# Patient Record
Sex: Male | Born: 1940
Health system: Southern US, Community
[De-identification: ages and names within clinical notes are randomized; demographics above are authoritative.]

## PROBLEM LIST (undated history)

## (undated) DIAGNOSIS — M1990 Unspecified osteoarthritis, unspecified site: Secondary | ICD-10-CM

## (undated) DIAGNOSIS — R413 Other amnesia: Secondary | ICD-10-CM

## (undated) HISTORY — PX: TONSILLECTOMY: SUR1361

## (undated) HISTORY — PX: JOINT REPLACEMENT: SHX530

## (undated) HISTORY — PX: COLONOSCOPY: SHX174

---

## 2006-03-03 ENCOUNTER — Ambulatory Visit: Payer: Self-pay | Admitting: Unknown Physician Specialty

## 2007-08-13 ENCOUNTER — Ambulatory Visit: Payer: Self-pay | Admitting: Internal Medicine

## 2008-03-28 ENCOUNTER — Ambulatory Visit: Payer: Self-pay | Admitting: Cardiology

## 2008-03-28 ENCOUNTER — Ambulatory Visit: Payer: Self-pay | Admitting: Specialist

## 2008-04-06 ENCOUNTER — Inpatient Hospital Stay: Payer: Self-pay | Admitting: Specialist

## 2010-02-16 ENCOUNTER — Ambulatory Visit: Payer: Self-pay | Admitting: Specialist

## 2010-02-28 ENCOUNTER — Inpatient Hospital Stay: Payer: Self-pay | Admitting: Specialist

## 2015-02-24 DIAGNOSIS — Z23 Encounter for immunization: Secondary | ICD-10-CM | POA: Diagnosis not present

## 2015-04-05 DIAGNOSIS — R030 Elevated blood-pressure reading, without diagnosis of hypertension: Secondary | ICD-10-CM | POA: Diagnosis not present

## 2015-04-05 DIAGNOSIS — M1991 Primary osteoarthritis, unspecified site: Secondary | ICD-10-CM | POA: Diagnosis not present

## 2015-04-05 DIAGNOSIS — Z0000 Encounter for general adult medical examination without abnormal findings: Secondary | ICD-10-CM | POA: Diagnosis not present

## 2015-04-05 DIAGNOSIS — Z6823 Body mass index (BMI) 23.0-23.9, adult: Secondary | ICD-10-CM | POA: Diagnosis not present

## 2015-05-25 DIAGNOSIS — Z13220 Encounter for screening for lipoid disorders: Secondary | ICD-10-CM | POA: Diagnosis not present

## 2015-05-25 DIAGNOSIS — Z125 Encounter for screening for malignant neoplasm of prostate: Secondary | ICD-10-CM | POA: Diagnosis not present

## 2015-05-25 DIAGNOSIS — I10 Essential (primary) hypertension: Secondary | ICD-10-CM | POA: Diagnosis not present

## 2015-05-25 DIAGNOSIS — Z79899 Other long term (current) drug therapy: Secondary | ICD-10-CM | POA: Diagnosis not present

## 2015-05-25 DIAGNOSIS — E559 Vitamin D deficiency, unspecified: Secondary | ICD-10-CM | POA: Diagnosis not present

## 2015-06-28 DIAGNOSIS — D125 Benign neoplasm of sigmoid colon: Secondary | ICD-10-CM | POA: Diagnosis not present

## 2015-06-28 DIAGNOSIS — Z13220 Encounter for screening for lipoid disorders: Secondary | ICD-10-CM | POA: Diagnosis not present

## 2015-06-28 DIAGNOSIS — Z125 Encounter for screening for malignant neoplasm of prostate: Secondary | ICD-10-CM | POA: Diagnosis not present

## 2015-06-28 DIAGNOSIS — E559 Vitamin D deficiency, unspecified: Secondary | ICD-10-CM | POA: Diagnosis not present

## 2015-06-28 DIAGNOSIS — Z1211 Encounter for screening for malignant neoplasm of colon: Secondary | ICD-10-CM | POA: Diagnosis not present

## 2015-06-28 DIAGNOSIS — Z0100 Encounter for examination of eyes and vision without abnormal findings: Secondary | ICD-10-CM | POA: Diagnosis not present

## 2015-06-28 DIAGNOSIS — Z0000 Encounter for general adult medical examination without abnormal findings: Secondary | ICD-10-CM | POA: Diagnosis not present

## 2015-06-28 DIAGNOSIS — Z79899 Other long term (current) drug therapy: Secondary | ICD-10-CM | POA: Diagnosis not present

## 2015-06-28 DIAGNOSIS — L720 Epidermal cyst: Secondary | ICD-10-CM | POA: Diagnosis not present

## 2015-07-03 DIAGNOSIS — Z1211 Encounter for screening for malignant neoplasm of colon: Secondary | ICD-10-CM | POA: Diagnosis not present

## 2015-08-22 DIAGNOSIS — H2513 Age-related nuclear cataract, bilateral: Secondary | ICD-10-CM | POA: Diagnosis not present

## 2015-10-11 DIAGNOSIS — L728 Other follicular cysts of the skin and subcutaneous tissue: Secondary | ICD-10-CM | POA: Diagnosis not present

## 2016-02-09 DIAGNOSIS — Z23 Encounter for immunization: Secondary | ICD-10-CM | POA: Diagnosis not present

## 2016-06-21 DIAGNOSIS — Z13220 Encounter for screening for lipoid disorders: Secondary | ICD-10-CM | POA: Diagnosis not present

## 2016-06-21 DIAGNOSIS — E559 Vitamin D deficiency, unspecified: Secondary | ICD-10-CM | POA: Diagnosis not present

## 2016-06-21 DIAGNOSIS — Z79899 Other long term (current) drug therapy: Secondary | ICD-10-CM | POA: Diagnosis not present

## 2016-06-21 DIAGNOSIS — Z125 Encounter for screening for malignant neoplasm of prostate: Secondary | ICD-10-CM | POA: Diagnosis not present

## 2016-06-28 DIAGNOSIS — E559 Vitamin D deficiency, unspecified: Secondary | ICD-10-CM | POA: Diagnosis not present

## 2016-06-28 DIAGNOSIS — M25511 Pain in right shoulder: Secondary | ICD-10-CM | POA: Diagnosis not present

## 2016-06-28 DIAGNOSIS — R918 Other nonspecific abnormal finding of lung field: Secondary | ICD-10-CM | POA: Diagnosis not present

## 2016-06-28 DIAGNOSIS — Z0000 Encounter for general adult medical examination without abnormal findings: Secondary | ICD-10-CM | POA: Diagnosis not present

## 2016-06-28 DIAGNOSIS — M19011 Primary osteoarthritis, right shoulder: Secondary | ICD-10-CM | POA: Diagnosis not present

## 2016-06-28 DIAGNOSIS — D649 Anemia, unspecified: Secondary | ICD-10-CM | POA: Diagnosis not present

## 2016-06-28 DIAGNOSIS — M1990 Unspecified osteoarthritis, unspecified site: Secondary | ICD-10-CM | POA: Diagnosis not present

## 2016-07-08 DIAGNOSIS — D649 Anemia, unspecified: Secondary | ICD-10-CM | POA: Diagnosis not present

## 2017-01-08 DIAGNOSIS — M79671 Pain in right foot: Secondary | ICD-10-CM | POA: Diagnosis not present

## 2017-01-08 DIAGNOSIS — M2031 Hallux varus (acquired), right foot: Secondary | ICD-10-CM | POA: Diagnosis not present

## 2017-01-08 DIAGNOSIS — M2021 Hallux rigidus, right foot: Secondary | ICD-10-CM | POA: Diagnosis not present

## 2017-07-03 DIAGNOSIS — R634 Abnormal weight loss: Secondary | ICD-10-CM | POA: Diagnosis not present

## 2017-07-03 DIAGNOSIS — R413 Other amnesia: Secondary | ICD-10-CM | POA: Diagnosis not present

## 2017-07-03 DIAGNOSIS — Z79899 Other long term (current) drug therapy: Secondary | ICD-10-CM | POA: Diagnosis not present

## 2017-07-03 DIAGNOSIS — E559 Vitamin D deficiency, unspecified: Secondary | ICD-10-CM | POA: Diagnosis not present

## 2017-07-03 DIAGNOSIS — Z1211 Encounter for screening for malignant neoplasm of colon: Secondary | ICD-10-CM | POA: Diagnosis not present

## 2017-07-03 DIAGNOSIS — R7309 Other abnormal glucose: Secondary | ICD-10-CM | POA: Diagnosis not present

## 2017-07-03 DIAGNOSIS — J439 Emphysema, unspecified: Secondary | ICD-10-CM | POA: Diagnosis not present

## 2017-07-03 DIAGNOSIS — Z125 Encounter for screening for malignant neoplasm of prostate: Secondary | ICD-10-CM | POA: Diagnosis not present

## 2017-07-11 DIAGNOSIS — Z1211 Encounter for screening for malignant neoplasm of colon: Secondary | ICD-10-CM | POA: Diagnosis not present

## 2017-10-07 DIAGNOSIS — E559 Vitamin D deficiency, unspecified: Secondary | ICD-10-CM | POA: Diagnosis not present

## 2017-10-07 DIAGNOSIS — Z13220 Encounter for screening for lipoid disorders: Secondary | ICD-10-CM | POA: Diagnosis not present

## 2017-10-07 DIAGNOSIS — M1990 Unspecified osteoarthritis, unspecified site: Secondary | ICD-10-CM | POA: Diagnosis not present

## 2017-10-07 DIAGNOSIS — Z0000 Encounter for general adult medical examination without abnormal findings: Secondary | ICD-10-CM | POA: Diagnosis not present

## 2017-10-07 DIAGNOSIS — Z79899 Other long term (current) drug therapy: Secondary | ICD-10-CM | POA: Diagnosis not present

## 2017-10-07 DIAGNOSIS — R7309 Other abnormal glucose: Secondary | ICD-10-CM | POA: Diagnosis not present

## 2018-01-07 DIAGNOSIS — Z13220 Encounter for screening for lipoid disorders: Secondary | ICD-10-CM | POA: Diagnosis not present

## 2018-01-07 DIAGNOSIS — R7309 Other abnormal glucose: Secondary | ICD-10-CM | POA: Diagnosis not present

## 2018-01-07 DIAGNOSIS — Z79899 Other long term (current) drug therapy: Secondary | ICD-10-CM | POA: Diagnosis not present

## 2018-01-14 DIAGNOSIS — E559 Vitamin D deficiency, unspecified: Secondary | ICD-10-CM | POA: Diagnosis not present

## 2018-01-14 DIAGNOSIS — R634 Abnormal weight loss: Secondary | ICD-10-CM | POA: Diagnosis not present

## 2018-02-25 DIAGNOSIS — Z23 Encounter for immunization: Secondary | ICD-10-CM | POA: Diagnosis not present

## 2018-04-08 DIAGNOSIS — K4090 Unilateral inguinal hernia, without obstruction or gangrene, not specified as recurrent: Secondary | ICD-10-CM | POA: Diagnosis not present

## 2018-06-16 ENCOUNTER — Ambulatory Visit: Payer: Self-pay | Admitting: General Surgery

## 2018-06-16 ENCOUNTER — Other Ambulatory Visit: Payer: Self-pay

## 2018-06-16 ENCOUNTER — Encounter
Admission: RE | Admit: 2018-06-16 | Discharge: 2018-06-16 | Disposition: A | Discharge status: Home / Self Care | Payer: Medicare HMO | Source: Ambulatory Visit | Attending: General Surgery | Admitting: General Surgery

## 2018-06-16 HISTORY — DX: Unspecified osteoarthritis, unspecified site: M19.90

## 2018-06-16 MED ORDER — CEFAZOLIN SODIUM-DEXTROSE 2-4 GM/100ML-% IV SOLN
2.0000 g | INTRAVENOUS | Status: AC
  Administered 2018-06-17: 2 g via INTRAVENOUS

## 2018-06-16 NOTE — Patient Instructions (Signed)
Your procedure is scheduled on: 06-17-18  Report to Same Day Surgery 2nd floor medical mall [facility] Entrance-take elevator on left to 2nd floor.  Check in with surgery information desk.) @ 6 AM   Remember: Instructions that are not followed completely may result in serious medical risk, up to and including death, or upon the discretion of your surgeon and anesthesiologist your surgery may need to be rescheduled.    _x___ 1. Do not eat food after midnight the night before your procedure. You may drink clear liquids up to 2 hours before you are scheduled to arrive at the hospital for your procedure.  Do not drink clear liquids within 2 hours of your scheduled arrival to the hospital.  Clear liquids include  --Water or Apple juice without pulp  --Clear carbohydrate beverage such as ClearFast or Gatorade  --Black Coffee or Clear Tea (No milk, no creamers, do not add anything to  the coffee or Tea   ____Ensure clear carbohydrate drink on the way to the hospital for bariatric patients  ____Ensure clear carbohydrate drink 3 hours before surgery for Dr Dwyane Luo patients if physician instructed.   No gum chewing or hard candies.     __x__ 2. No Alcohol for 24 hours before or after surgery.   __x__3. No Smoking or e-cigarettes for 24 prior to surgery.  Do not use any chewable tobacco products for at least 6 hour prior to surgery   ____  4. Bring all medications with you on the day of surgery if instructed.    __x__ 5. Notify your doctor if there is any change in your medical condition     (cold, fever, infections).    x___6. On the morning of surgery brush your teeth with toothpaste and water.  You may rinse your mouth with mouth wash if you wish.  Do not swallow any toothpaste or mouthwash.   Do not wear jewelry, make-up, hairpins, clips or nail polish.  Do not wear lotions, powders, or perfumes. You may wear deodorant.  Do not shave 48 hours prior to surgery. Men may shave face and  neck.  Do not bring valuables to the hospital.    [facility] is not responsible for any belongings or valuables.               Contacts, dentures or bridgework may not be worn into surgery.  Leave your suitcase in the car. After surgery it may be brought to your room.  For patients admitted to the hospital, discharge time is determined by your                       treatment team.  _  Patients discharged the day of surgery will not be allowed to drive home.  You will need someone to drive you home and stay with you the night of your procedure.    Please read over the following fact sheets that you were given:   [facility] Preparing for Surgery and or MRSA Information   ____ Take anti-hypertensive listed below, cardiac, seizure, asthma,     anti-reflux and psychiatric medicines. These include:  1. NONE  2.  3.  4.  5.  6.  ____Fleets enema or Magnesium Citrate as directed.   ____ Use CHG Soap or sage wipes as directed on instruction sheet   ____ Use inhalers on the day of surgery and bring to hospital day of surgery  ____ Stop Metformin and Janumet 2 days  prior to surgery.    ____ Take 1/2 of usual insulin dose the night before surgery and none on the morning surgery.   ____ Follow recommendations from Cardiologist, Pulmonologist or PCP regarding stopping Aspirin, Coumadin, Plavix ,Eliquis, Effient, or Pradaxa, and Pletal.  X____Stop Anti-inflammatories such as Advil, Aleve, Ibuprofen, Motrin, Naproxen, Naprosyn, Goodies powders or aspirin products NOW-OK to take Tylenol    ____ Stop supplements until after surgery.     ____ Bring C-Pap to the hospital.

## 2018-06-16 NOTE — H&P (Signed)
PATIENT PROFILE: Jesus Scott is a 78 y.o. male who presents to the Clinic for consultation at the request of Dr. Doy Hutching for evaluation of left inguinal hernia.  PCP:  Idelle Crouch, MD  HISTORY OF PRESENT ILLNESS: Jesus Scott reports having a left inguinal hernia since a year and a half ago. He refers that he is feeling more pain and discomfort on the left inguinal area. Pain does not radiates. Pain is aggravated with heavy lifting. Pain improved with sitting down and resting. He feels the bulge that is able to reduce lying down. Denies abdominal pain, abdominal distention, nausea or vomiting.    PROBLEM LIST:         Problem List  Date Reviewed: 05/27/2018         Noted   Memory disturbance 05/27/2018   Adenomatous polyp of sigmoid colon 06/28/2015   Vitamin D deficiency, unspecified 06/28/2015   Osteoarthritis Unknown   Overview    with bilateral knee pain      Internal hemorrhoids Unknown      GENERAL REVIEW OF SYSTEMS:   General ROS: negative for - chills, fatigue, fever, weight gain or weight loss Allergy and Immunology ROS: negative for - hives  Hematological and Lymphatic ROS: negative for - bleeding problems or bruising, negative for palpable nodes Endocrine ROS: negative for - heat or cold intolerance, hair changes Respiratory ROS: negative for - cough, shortness of breath or wheezing Cardiovascular ROS: no chest pain or palpitations GI ROS: negative for nausea, vomiting, abdominal pain, diarrhea, constipation Musculoskeletal ROS: negative for - joint swelling or muscle pain Neurological ROS: negative for - confusion, syncope Dermatological ROS: negative for pruritus and rash Psychiatric: negative for anxiety, depression, difficulty sleeping. Positive for memory loss  MEDICATIONS: CurrentMedications        Current Outpatient Medications  Medication Sig Dispense Refill  . amitriptyline (ELAVIL) 25 MG tablet Take 1 tablet (25 mg total) by  mouth nightly 90 tablet 3  . ascorbic acid, vitamin C, (VITAMIN C) 500 MG tablet Take 500 mg by mouth once daily.    . cholecalciferol (CHOLECALCIFEROL) 1,000 unit tablet Take 5,000 Units by mouth.      . NON FORMULARY Prevagen  -- 1 tablet every day    . VITAMIN B COMPLEX (B COMPLEX 1 ORAL) Take by mouth.     No current facility-administered medications for this visit.       ALLERGIES: Patient has no known allergies.  PAST MEDICAL HISTORY:     Past Medical History:  Diagnosis Date  . Internal hemorrhoids   . Osteoarthritis    with bilateral knee pain    PAST SURGICAL HISTORY:      Past Surgical History:  Procedure Laterality Date  . BILATERAL KNEE SURGERIES    . COLONOSCOPY  03/03/2006   Hyperplastic Polyps: CBF 02/2016; Recall Ltr mailed 01/01/2016 (dw)  . TONSILLECTOMY    . VASECTOMY       FAMILY HISTORY:      Family History  Problem Relation Age of Onset  . Stroke Mother 55     SOCIAL HISTORY: Social History          Socioeconomic History  . Marital status: Married    Spouse name: Not on file  . Number of children: Not on file  . Years of education: Not on file  . Highest education level: Not on file  Occupational History  . Not on file  Social Needs  . Financial resource strain: Not on file  .  Food insecurity:    Worry: Not on file    Inability: Not on file  . Transportation needs:    Medical: Not on file    Non-medical: Not on file  Tobacco Use  . Smoking status: Light Tobacco Smoker    Types: Cigars  . Smokeless tobacco: Never Used  . Tobacco comment: occasionally  Substance and Sexual Activity  . Alcohol use: Yes    Comment: ONLY DRINKS OCCASIONAL ALCOHOL  . Drug use: Not on file  . Sexual activity: Not on file  Other Topics Concern  . Not on file  Social History Narrative  . Not on file      PHYSICAL EXAM:    Vitals:   06/09/18 0950  BP: 127/71  Pulse: 77   Body mass index is  22.84 kg/m. Weight: 80.7 kg (177 lb 14.6 oz)   GENERAL: Alert, active, oriented x3  HEENT: Pupils equal reactive to light. Extraocular movements are intact. Sclera clear. Palpebral conjunctiva normal red color.Pharynx clear.  NECK: Supple with no palpable mass and no adenopathy.  LUNGS: Sound clear with no rales rhonchi or wheezes.  HEART: Regular rhythm S1 and S2 without murmur.  ABDOMEN: Soft and depressible, nontender with no palpable mass, no hepatomegaly. Left inguinal hernia, reducible, soft, mild discomfort.   EXTREMITIES: Well-developed well-nourished symmetrical with no dependent edema.  NEUROLOGICAL: Awake alert oriented, facial expression symmetrical, moving all extremities.  REVIEW OF DATA: I have reviewed the following data today:      No visits with results within 3 Month(s) from this visit.  Latest known visit with results is:  Appointment on 01/07/2018  Component Date Value  . Glucose 01/07/2018 90   . Sodium 01/07/2018 139   . Potassium 01/07/2018 4.1   . Chloride 01/07/2018 105   . Carbon Dioxide (CO2) 01/07/2018 28.6   . Urea Nitrogen (BUN) 01/07/2018 12   . Creatinine 01/07/2018 0.9   . Glomerular Filtration Ra* 01/07/2018 82   . Calcium 01/07/2018 8.5*  . AST  01/07/2018 16   . ALT  01/07/2018 10   . Alk Phos (alkaline Phosp* 01/07/2018 51   . Albumin 01/07/2018 3.8   . Bilirubin, Total 01/07/2018 0.7   . Protein, Total 01/07/2018 6.8   . A/G Ratio 01/07/2018 1.3   . WBC (White Blood Cell Co* 01/07/2018 4.2   . RBC (Red Blood Cell Coun* 01/07/2018 4.11*  . Hemoglobin 01/07/2018 13.1*  . Hematocrit 01/07/2018 38.5*  . MCV (Mean Corpuscular Vo* 01/07/2018 93.7   . MCH (Mean Corpuscular He* 01/07/2018 31.9*  . MCHC (Mean Corpuscular H* 01/07/2018 34.0   . Platelet Count 01/07/2018 146*  . RDW-CV (Red Cell Distrib* 01/07/2018 12.3   . MPV (Mean Platelet Volum* 01/07/2018 8.6*  . Neutrophils 01/07/2018 2.27   . Lymphocytes 01/07/2018 1.31    . Monocytes 01/07/2018 0.43   . Eosinophils 01/07/2018 0.11   . Basophils 01/07/2018 0.04   . Neutrophil % 01/07/2018 54.5   . Lymphocyte % 01/07/2018 31.4   . Monocyte % 01/07/2018 10.3   . Eosinophil % 01/07/2018 2.6   . Basophil% 01/07/2018 1.0   . Immature Granulocyte % 01/07/2018 0.2   . Immature Granulocyte Cou* 01/07/2018 0.01   . Cholesterol, Total 01/07/2018 141   . Hemoglobin A1C 01/07/2018 5.5   . Average Blood Glucose (C* 01/07/2018 111      ASSESSMENT: Jesus Scott is a 79 y.o. male presenting for consultation for left inguinal hernia.    The patient presents with  a symptomatic, reducible inguinal hernia. Patient was oriented about the diagnosis of inguinal hernia and its implication. The patient was oriented about the treatment alternatives (observation vs surgical repair). Due to patient symptoms, repair is recommended. Patient oriented about the surgical procedure, the use of mesh and its risk of complications such as: infection, bleeding, injury to vas deference, vasculature and testicle, injury to bowel or bladder, and chronic pain.  Non-recurrent unilateral inguinal hernia without obstruction or gangrene [K40.90]  PLAN: 1. Left inguinal hernia repair with mesh ([id number]) 2. CBC, CMP 3. Contact us if has any question or concer.   Patient and his wife verbalized understanding, all questions were answered, and were agreeable with the plan outlined above.    Herbert Pun, MD  Electronically signed by Herbert Pun, MD

## 2018-06-16 NOTE — H&P (View-Only) (Signed)
PATIENT PROFILE: Jesus Scott is a 78 y.o. male who presents to the Clinic for consultation at the request of Dr. Doy Hutching for evaluation of left inguinal hernia.  PCP:  Idelle Crouch, MD  HISTORY OF PRESENT ILLNESS: [name] reports having a left inguinal hernia since a year and a half ago. He refers that he is feeling more pain and discomfort on the left inguinal area. Pain does not radiates. Pain is aggravated with heavy lifting. Pain improved with sitting down and resting. He feels the bulge that is able to reduce lying down. Denies abdominal pain, abdominal distention, nausea or vomiting.    PROBLEM LIST:         Problem List  Date Reviewed: 05/27/2018         Noted   Memory disturbance 05/27/2018   Adenomatous polyp of sigmoid colon 06/28/2015   Vitamin D deficiency, unspecified 06/28/2015   Osteoarthritis Unknown   Overview    with bilateral knee pain      Internal hemorrhoids Unknown      GENERAL REVIEW OF SYSTEMS:   General ROS: negative for - chills, fatigue, fever, weight gain or weight loss Allergy and Immunology ROS: negative for - hives  Hematological and Lymphatic ROS: negative for - bleeding problems or bruising, negative for palpable nodes Endocrine ROS: negative for - heat or cold intolerance, hair changes Respiratory ROS: negative for - cough, shortness of breath or wheezing Cardiovascular ROS: no chest pain or palpitations GI ROS: negative for nausea, vomiting, abdominal pain, diarrhea, constipation Musculoskeletal ROS: negative for - joint swelling or muscle pain Neurological ROS: negative for - confusion, syncope Dermatological ROS: negative for pruritus and rash Psychiatric: negative for anxiety, depression, difficulty sleeping. Positive for memory loss  MEDICATIONS: CurrentMedications        Current Outpatient Medications  Medication Sig Dispense Refill  . amitriptyline (ELAVIL) 25 MG tablet Take 1 tablet (25 mg total) by  mouth nightly 90 tablet 3  . ascorbic acid, vitamin C, (VITAMIN C) 500 MG tablet Take 500 mg by mouth once daily.    . cholecalciferol (CHOLECALCIFEROL) 1,000 unit tablet Take 5,000 Units by mouth.      . NON FORMULARY Prevagen  -- 1 tablet every day    . VITAMIN B COMPLEX (B COMPLEX 1 ORAL) Take by mouth.     No current facility-administered medications for this visit.       ALLERGIES: Patient has no known allergies.  PAST MEDICAL HISTORY:     Past Medical History:  Diagnosis Date  . Internal hemorrhoids   . Osteoarthritis    with bilateral knee pain    PAST SURGICAL HISTORY:      Past Surgical History:  Procedure Laterality Date  . BILATERAL KNEE SURGERIES    . COLONOSCOPY  03/03/2006   Hyperplastic Polyps: CBF 02/2016; Recall Ltr mailed 01/01/2016 (dw)  . TONSILLECTOMY    . VASECTOMY       FAMILY HISTORY:      Family History  Problem Relation Age of Onset  . Stroke Mother 70     SOCIAL HISTORY: Social History          Socioeconomic History  . Marital status: Married    Spouse name: Not on file  . Number of children: Not on file  . Years of education: Not on file  . Highest education level: Not on file  Occupational History  . Not on file  Social Needs  . Financial resource strain: Not on file  .  Food insecurity:    Worry: Not on file    Inability: Not on file  . Transportation needs:    Medical: Not on file    Non-medical: Not on file  Tobacco Use  . Smoking status: Light Tobacco Smoker    Types: Cigars  . Smokeless tobacco: Never Used  . Tobacco comment: occasionally  Substance and Sexual Activity  . Alcohol use: Yes    Comment: ONLY DRINKS OCCASIONAL ALCOHOL  . Drug use: Not on file  . Sexual activity: Not on file  Other Topics Concern  . Not on file  Social History Narrative  . Not on file      PHYSICAL EXAM:    Vitals:   06/09/18 0950  BP: 127/71  Pulse: 77   Body mass index is  22.84 kg/m. Weight: 80.7 kg (177 lb 14.6 oz)   GENERAL: Alert, active, oriented x3  HEENT: Pupils equal reactive to light. Extraocular movements are intact. Sclera clear. Palpebral conjunctiva normal red color.Pharynx clear.  NECK: Supple with no palpable mass and no adenopathy.  LUNGS: Sound clear with no rales rhonchi or wheezes.  HEART: Regular rhythm S1 and S2 without murmur.  ABDOMEN: Soft and depressible, nontender with no palpable mass, no hepatomegaly. Left inguinal hernia, reducible, soft, mild discomfort.   EXTREMITIES: Well-developed well-nourished symmetrical with no dependent edema.  NEUROLOGICAL: Awake alert oriented, facial expression symmetrical, moving all extremities.  REVIEW OF DATA: I have reviewed the following data today:      No visits with results within 3 Month(s) from this visit.  Latest known visit with results is:  Appointment on 01/07/2018  Component Date Value  . Glucose 01/07/2018 90   . Sodium 01/07/2018 139   . Potassium 01/07/2018 4.1   . Chloride 01/07/2018 105   . Carbon Dioxide (CO2) 01/07/2018 28.6   . Urea Nitrogen (BUN) 01/07/2018 12   . Creatinine 01/07/2018 0.9   . Glomerular Filtration Ra* 01/07/2018 82   . Calcium 01/07/2018 8.5*  . AST  01/07/2018 16   . ALT  01/07/2018 10   . Alk Phos (alkaline Phosp* 01/07/2018 51   . Albumin 01/07/2018 3.8   . Bilirubin, Total 01/07/2018 0.7   . Protein, Total 01/07/2018 6.8   . A/G Ratio 01/07/2018 1.3   . WBC (White Blood Cell Co* 01/07/2018 4.2   . RBC (Red Blood Cell Coun* 01/07/2018 4.11*  . Hemoglobin 01/07/2018 13.1*  . Hematocrit 01/07/2018 38.5*  . MCV (Mean Corpuscular Vo* 01/07/2018 93.7   . MCH (Mean Corpuscular He* 01/07/2018 31.9*  . MCHC (Mean Corpuscular H* 01/07/2018 34.0   . Platelet Count 01/07/2018 146*  . RDW-CV (Red Cell Distrib* 01/07/2018 12.3   . MPV (Mean Platelet Volum* 01/07/2018 8.6*  . Neutrophils 01/07/2018 2.27   . Lymphocytes 01/07/2018 1.31    . Monocytes 01/07/2018 0.43   . Eosinophils 01/07/2018 0.11   . Basophils 01/07/2018 0.04   . Neutrophil % 01/07/2018 54.5   . Lymphocyte % 01/07/2018 31.4   . Monocyte % 01/07/2018 10.3   . Eosinophil % 01/07/2018 2.6   . Basophil% 01/07/2018 1.0   . Immature Granulocyte % 01/07/2018 0.2   . Immature Granulocyte Cou* 01/07/2018 0.01   . Cholesterol, Total 01/07/2018 141   . Hemoglobin A1C 01/07/2018 5.5   . Average Blood Glucose (C* 01/07/2018 111      ASSESSMENT: Mr. Mendiola is a 78 y.o. male presenting for consultation for left inguinal hernia.    The patient presents with  a symptomatic, reducible inguinal hernia. Patient was oriented about the diagnosis of inguinal hernia and its implication. The patient was oriented about the treatment alternatives (observation vs surgical repair). Due to patient symptoms, repair is recommended. Patient oriented about the surgical procedure, the use of mesh and its risk of complications such as: infection, bleeding, injury to vas deference, vasculature and testicle, injury to bowel or bladder, and chronic pain.  Non-recurrent unilateral inguinal hernia without obstruction or gangrene [K40.90]  PLAN: 1. Left inguinal hernia repair with mesh (61518) 2. CBC, CMP 3. Contact us if has any question or concer.   Patient and his wife verbalized understanding, all questions were answered, and were agreeable with the plan outlined above.    Herbert Pun, MD  Electronically signed by Herbert Pun, MD

## 2018-06-17 ENCOUNTER — Ambulatory Visit
Admission: RE | Admit: 2018-06-17 | Discharge: 2018-06-17 | Disposition: A | Discharge status: Home / Self Care | Payer: Medicare HMO | Attending: General Surgery | Admitting: General Surgery

## 2018-06-17 ENCOUNTER — Ambulatory Visit: Payer: Medicare HMO | Admitting: Anesthesiology

## 2018-06-17 ENCOUNTER — Encounter: Payer: Self-pay | Admitting: *Deleted

## 2018-06-17 ENCOUNTER — Other Ambulatory Visit: Payer: Self-pay

## 2018-06-17 ENCOUNTER — Encounter
Admission: RE | Disposition: A | Discharge status: Home / Self Care | Payer: Self-pay | Source: Home / Self Care | Attending: General Surgery

## 2018-06-17 DIAGNOSIS — E559 Vitamin D deficiency, unspecified: Secondary | ICD-10-CM | POA: Insufficient documentation

## 2018-06-17 DIAGNOSIS — K4090 Unilateral inguinal hernia, without obstruction or gangrene, not specified as recurrent: Secondary | ICD-10-CM | POA: Insufficient documentation

## 2018-06-17 DIAGNOSIS — Z79899 Other long term (current) drug therapy: Secondary | ICD-10-CM | POA: Diagnosis not present

## 2018-06-17 DIAGNOSIS — F17290 Nicotine dependence, other tobacco product, uncomplicated: Secondary | ICD-10-CM | POA: Insufficient documentation

## 2018-06-17 HISTORY — PX: INGUINAL HERNIA REPAIR: SHX194

## 2018-06-17 SURGERY — REPAIR, HERNIA, INGUINAL, ADULT
Anesthesia: General | Site: Abdomen | Laterality: Left

## 2018-06-17 MED ORDER — LIDOCAINE HCL (CARDIAC) PF 100 MG/5ML IV SOSY
PREFILLED_SYRINGE | INTRAVENOUS | Status: DC | PRN
  Administered 2018-06-17: 100 mg via INTRAVENOUS

## 2018-06-17 MED ORDER — BUPIVACAINE-EPINEPHRINE (PF) 0.25% -1:200000 IJ SOLN
INTRAMUSCULAR | Status: AC
  Filled 2018-06-17: qty 30

## 2018-06-17 MED ORDER — MEPERIDINE HCL 50 MG/ML IJ SOLN: 6.2500 mg | INTRAMUSCULAR | Status: DC | PRN

## 2018-06-17 MED ORDER — ROCURONIUM BROMIDE 50 MG/5ML IV SOLN
INTRAVENOUS | Status: AC
  Filled 2018-06-17: qty 1

## 2018-06-17 MED ORDER — OXYCODONE HCL 5 MG PO TABS: 5.0000 mg | ORAL_TABLET | Freq: Once | ORAL | Status: DC | PRN

## 2018-06-17 MED ORDER — SUGAMMADEX SODIUM 200 MG/2ML IV SOLN
INTRAVENOUS | Status: DC | PRN
  Administered 2018-06-17: 200 mg via INTRAVENOUS

## 2018-06-17 MED ORDER — FAMOTIDINE 20 MG PO TABS: 20.0000 mg | ORAL_TABLET | Freq: Once | ORAL | Status: DC

## 2018-06-17 MED ORDER — OXYCODONE HCL 5 MG/5ML PO SOLN: 5.0000 mg | Freq: Once | ORAL | Status: DC | PRN

## 2018-06-17 MED ORDER — FENTANYL CITRATE (PF) 100 MCG/2ML IJ SOLN
INTRAMUSCULAR | Status: DC | PRN
  Administered 2018-06-17 (×2): 50 ug via INTRAVENOUS

## 2018-06-17 MED ORDER — ONDANSETRON HCL 4 MG/2ML IJ SOLN
INTRAMUSCULAR | Status: AC
  Filled 2018-06-17: qty 2

## 2018-06-17 MED ORDER — PHENYLEPHRINE HCL 10 MG/ML IJ SOLN
INTRAMUSCULAR | Status: DC | PRN
  Administered 2018-06-17 (×2): 100 ug via INTRAVENOUS

## 2018-06-17 MED ORDER — LIDOCAINE HCL (PF) 2 % IJ SOLN
INTRAMUSCULAR | Status: AC
  Filled 2018-06-17: qty 10

## 2018-06-17 MED ORDER — FENTANYL CITRATE (PF) 100 MCG/2ML IJ SOLN
25.0000 ug | INTRAMUSCULAR | Status: DC | PRN
  Administered 2018-06-17: 25 ug via INTRAVENOUS

## 2018-06-17 MED ORDER — DEXAMETHASONE SODIUM PHOSPHATE 10 MG/ML IJ SOLN
INTRAMUSCULAR | Status: AC
  Filled 2018-06-17: qty 1

## 2018-06-17 MED ORDER — PROPOFOL 10 MG/ML IV BOLUS
INTRAVENOUS | Status: AC
  Filled 2018-06-17: qty 20

## 2018-06-17 MED ORDER — FENTANYL CITRATE (PF) 100 MCG/2ML IJ SOLN
INTRAMUSCULAR | Status: AC
  Filled 2018-06-17: qty 2

## 2018-06-17 MED ORDER — ONDANSETRON HCL 4 MG/2ML IJ SOLN
INTRAMUSCULAR | Status: DC | PRN
  Administered 2018-06-17: 4 mg via INTRAVENOUS

## 2018-06-17 MED ORDER — BUPIVACAINE-EPINEPHRINE 0.25% -1:200000 IJ SOLN
INTRAMUSCULAR | Status: DC | PRN
  Administered 2018-06-17: 12 mL
  Administered 2018-06-17: 18 mL

## 2018-06-17 MED ORDER — PROPOFOL 10 MG/ML IV BOLUS
INTRAVENOUS | Status: DC | PRN
  Administered 2018-06-17: 160 mg via INTRAVENOUS

## 2018-06-17 MED ORDER — DEXAMETHASONE SODIUM PHOSPHATE 10 MG/ML IJ SOLN
INTRAMUSCULAR | Status: DC | PRN
  Administered 2018-06-17: 10 mg via INTRAVENOUS

## 2018-06-17 MED ORDER — SUCCINYLCHOLINE CHLORIDE 20 MG/ML IJ SOLN
INTRAMUSCULAR | Status: AC
  Filled 2018-06-17: qty 1

## 2018-06-17 MED ORDER — HYDROCODONE-ACETAMINOPHEN 5-325 MG PO TABS: 1.0000 | ORAL_TABLET | ORAL | 0 refills | Status: AC | PRN

## 2018-06-17 MED ORDER — LACTATED RINGERS IV SOLN
INTRAVENOUS | Status: DC
  Administered 2018-06-17: 08:00:00 via INTRAVENOUS

## 2018-06-17 MED ORDER — PROMETHAZINE HCL 25 MG/ML IJ SOLN: 6.2500 mg | INTRAMUSCULAR | Status: DC | PRN

## 2018-06-17 MED ORDER — ROCURONIUM BROMIDE 100 MG/10ML IV SOLN
INTRAVENOUS | Status: DC | PRN
  Administered 2018-06-17: 50 mg via INTRAVENOUS

## 2018-06-17 SURGICAL SUPPLY — 32 items
BLADE SURG 15 STRL LF DISP TIS (BLADE) ×1 IMPLANT
BLADE SURG 15 STRL SS (BLADE) ×2
CANISTER SUCT 1200ML W/VALVE (MISCELLANEOUS) ×3 IMPLANT
CHLORAPREP W/TINT 26ML (MISCELLANEOUS) ×3 IMPLANT
COVER WAND RF STERILE (DRAPES) ×3 IMPLANT
DERMABOND ADVANCED (GAUZE/BANDAGES/DRESSINGS) ×2
DERMABOND ADVANCED .7 DNX12 (GAUZE/BANDAGES/DRESSINGS) ×1 IMPLANT
DRAIN PENROSE 1/4X12 LTX (DRAIN) ×3 IMPLANT
DRAPE LAPAROTOMY 100X77 ABD (DRAPES) ×3 IMPLANT
ELECT REM PT RETURN 9FT ADLT (ELECTROSURGICAL) ×3
ELECTRODE REM PT RTRN 9FT ADLT (ELECTROSURGICAL) ×1 IMPLANT
GLOVE BIO SURGEON STRL SZ 6.5 (GLOVE) ×4 IMPLANT
GLOVE BIO SURGEONS STRL SZ 6.5 (GLOVE) ×2
GLOVE BIOGEL PI IND STRL 6.5 (GLOVE) ×1 IMPLANT
GLOVE BIOGEL PI INDICATOR 6.5 (GLOVE) ×2
GOWN STRL REUS W/ TWL LRG LVL3 (GOWN DISPOSABLE) ×2 IMPLANT
GOWN STRL REUS W/TWL LRG LVL3 (GOWN DISPOSABLE) ×4
LABEL OR SOLS (LABEL) ×3 IMPLANT
MESH SYNTHETIC 1.8X4 KEYHOLE S (Mesh General) IMPLANT
MESH SYNTHETIC KEYHOLE S (Mesh General) ×2 IMPLANT
NEEDLE HYPO 22GX1.5 SAFETY (NEEDLE) ×3 IMPLANT
NS IRRIG 500ML POUR BTL (IV SOLUTION) ×3 IMPLANT
PACK BASIN MINOR ARMC (MISCELLANEOUS) ×3 IMPLANT
SUT MNCRL 4-0 (SUTURE) ×2
SUT MNCRL 4-0 27XMFL (SUTURE) ×1
SUT SURGILON 0 BLK (SUTURE) ×6 IMPLANT
SUT VIC AB 2-0 BRD 54 (SUTURE) ×3 IMPLANT
SUT VIC AB 2-0 CT2 27 (SUTURE) ×3 IMPLANT
SUT VIC AB 3-0 SH 27 (SUTURE) ×4
SUT VIC AB 3-0 SH 27X BRD (SUTURE) ×2 IMPLANT
SUTURE MNCRL 4-0 27XMF (SUTURE) ×1 IMPLANT
SYR 10ML LL (SYRINGE) ×3 IMPLANT

## 2018-06-17 NOTE — Anesthesia Procedure Notes (Signed)
Procedure Name: Intubation Date/Time: 06/17/2018 7:46 AM Performed by: Philbert Riser, CRNA Pre-anesthesia Checklist: Patient identified, Emergency Drugs available, Suction available, Patient being monitored and Timeout performed Patient Re-evaluated:Patient Re-evaluated prior to induction Oxygen Delivery Method: Circle system utilized and Simple face mask Preoxygenation: Pre-oxygenation with 100% oxygen Induction Type: IV induction Ventilation: Mask ventilation without difficulty Laryngoscope Size: McGraph and 3 Grade View: Grade I Tube type: Oral Tube size: 7.5 mm Number of attempts: 1 Airway Equipment and Method: Stylet Placement Confirmation: ETT inserted through vocal cords under direct vision,  positive ETCO2 and breath sounds checked- equal and bilateral Secured at: 23 cm Tube secured with: Tape Dental Injury: Teeth and Oropharynx as per pre-operative assessment

## 2018-06-17 NOTE — Op Note (Signed)
Preoperative diagnosis: eft Inguinal Hernia.  Postoperative diagnosis: Left Indirect Inguinal Hernia.  Procedure: Left Inguinal hernia repair with mesh  Anesthesia: General  Surgeon: Dr. Windell Moment  Wound Classification: Clean  Indications:  Patient is a 78 y.o. male developed a symptomatic left inguinal hernia. Repair was indicated to avoid complications of incarceration, obstruction and pain, and a prosthetic mesh repair was elected.  Findings: 1. Vas Deferens and cord structures identified and preserved 2. An indirect inguinal hernia was identified 3. Adequate hemostasis achieved  Description of procedure: The patient was taken to the operating room. A time-out was completed verifying correct patient, procedure, site, positioning, and implant(s) and/or special equipment prior to beginning this procedure. spinal anesthesia was induced. The left groin was prepped and draped in the usual sterile fashion. An incision was marked in a natural skin crease and planned to end near the pubic tubercle.  The skin crease incision was made with a knife and deepened through Scarpa's and Camper's fascia with electrocautery until the aponeurosis of the external oblique was encountered. This was cleaned and the external ring was exposed. Hemostasis was achieved in the wound. An incision was made in the midportion of the external oblique aponeurosis in the direction of its fibers. The ilioinguinal nerve was identified and protected throughout the dissection. Flaps of the external oblique were developed cephalad and inferiorly.  The cord was identified. It was gently dissected free at the pubic tubercle and encircled with a Penrose drain. Attention was directed to the anteromedial aspect of the cord, where an indirect hernia sac was identified. The sac was carefully dissected free of the cord down to the level of the internal ring. The vas and testicular vessels were identified and protected from harm. The sac  was checked for hemostasis and allowed to retract into the abdomen.  Attention then turned to the floor of the canal, which appeared to be grossly weakened without a well-defined defect or sac. The Pre Shaped mesh was inserted. Beginning at the pubic tubercle, the mesh was sutured to the inguinal ligament inferiorly and the conjoint tendon superiorly using interrupted 0 nonabsorbable sutures. Care was taken to assure that the mesh was placed in a relaxed fashion to avoid excessive tension and that no neurovascular structures were caught in the repair. Laterally, the tails of the mesh were crossed and the internal ring recreated.  Hemostasis was again checked. The Penrose drain was removed. The external oblique aponeurosis was closed with a running suture of 3-0 Vicryl, taking care not to catch the ilioinguinal nerve in the suture line. Scarpa's fascia was closed with interrupted 3-0 Vicryl.  The skin was closed with a subcuticular stitch of Monocryl 4-0. Dermabond was applied.  The testis was gently pulled down into its anatomic position in the scrotum.  The patient tolerated the procedure well and was taken to the postanesthesia care unit in stable condition.   Specimen: Cord lipoma  Complications: None  Estimated Blood Loss: 5 mL

## 2018-06-17 NOTE — Anesthesia Post-op Follow-up Note (Signed)
Anesthesia QCDR form completed.        

## 2018-06-17 NOTE — Interval H&P Note (Signed)
History and Physical Interval Note:  06/17/2018 7:07 AM  Jesus Scott  has presented today for surgery, with the diagnosis of NONRECURRENT INGUINAL HERNIA WITHOUT OBSTRUCTION OR GANGRENE  The various methods of treatment have been discussed with the patient and family. After consideration of risks, benefits and other options for treatment, the patient has consented to  Procedure(s): HERNIA REPAIR INGUINAL ADULT (Left) as a surgical intervention .  The patient's history has been reviewed, patient examined, no change in status, stable for surgery.  I have reviewed the patient's chart and labs.  Left inguinal area marked in the pre procedure room. Questions were answered to the patient's satisfaction.     Herbert Pun

## 2018-06-17 NOTE — Anesthesia Postprocedure Evaluation (Signed)
Anesthesia Post Note  Patient: Jesus Scott  Procedure(s) Performed: HERNIA REPAIR INGUINAL ADULT (Left Abdomen)  Patient location during evaluation: PACU Anesthesia Type: General Level of consciousness: awake and alert and oriented Pain management: pain level controlled Vital Signs Assessment: post-procedure vital signs reviewed and stable Respiratory status: spontaneous breathing, nonlabored ventilation and respiratory function stable Cardiovascular status: blood pressure returned to baseline and stable Postop Assessment: no signs of nausea or vomiting Anesthetic complications: no     Last Vitals:  Vitals:   06/17/18 1012 06/17/18 1017  BP: 121/69 137/73  Pulse: 64 71  Resp:  16  Temp: (!) 36.1 C 36.4 C  SpO2: 95% 99%    Last Pain:  Vitals:   06/17/18 1017  TempSrc: Temporal  PainSc: 0-No pain                 [name]

## 2018-06-17 NOTE — Transfer of Care (Signed)
Immediate Anesthesia Transfer of Care Note  Patient: Jesus Scott  Procedure(s) Performed: HERNIA REPAIR INGUINAL ADULT (Left Abdomen)  Patient Location: PACU  Anesthesia Type:General  Level of Consciousness: awake, alert  and oriented  Airway & Oxygen Therapy: Patient Spontanous Breathing and Patient connected to face mask oxygen  Post-op Assessment: Report given to RN and Post -op Vital signs reviewed and stable  Post vital signs: Reviewed and stable  Last Vitals:  Vitals Value Taken Time  BP 133/69 06/17/2018  9:27 AM  Temp    Pulse 77 06/17/2018  9:30 AM  Resp    SpO2 99 % 06/17/2018  9:30 AM  Vitals shown include unvalidated device data.  Last Pain:  Vitals:   06/17/18 0700  TempSrc: Oral  PainSc: 0-No pain         Complications: No apparent anesthesia complications

## 2018-06-17 NOTE — Discharge Instructions (Signed)
AMBULATORY SURGERY  DISCHARGE INSTRUCTIONS   1) The drugs that you were given will stay in your system until tomorrow so for the next 24 hours you should not:  A) Drive an automobile B) Make any legal decisions C) Drink any alcoholic beverage   2) You may resume regular meals tomorrow.  Today it is better to start with liquids and gradually work up to solid foods.  You may eat anything you prefer, but it is better to start with liquids, then soup and crackers, and gradually work up to solid foods.   3) Please notify your doctor immediately if you have any unusual bleeding, trouble breathing, redness and pain at the surgery site, drainage, fever, or pain not relieved by medication.    4) Additional Instructions:    Laparoscopic Inguinal Hernia Repair, Adult, Care After This sheet gives you information about how to care for yourself after your procedure. Your health care provider may also give you more specific instructions. If you have problems or questions, contact your health care provider. What can I expect after the procedure? After the procedure, it is common to have:  Pain.  Swelling and bruising around the incision area.  Scrotal swelling, in men.  Some fluid or blood draining from your incisions. Follow these instructions at home: Incision care  Follow instructions from your health care provider about how to take care of your incisions. Make sure you: ? Wash your hands with soap and water before you change your bandage (dressing). If soap and water are not available, use hand sanitizer. ? Change your dressing as told by your health care provider. ? Leave stitches (sutures), skin glue, or adhesive strips in place. These skin closures may need to stay in place for 2 weeks or longer. If adhesive strip edges start to loosen and curl up, you may trim the loose edges. Do not remove adhesive strips completely unless your health care provider tells you to do that.  Check  your incision area every day for signs of infection. Check for: ? More redness, swelling, or pain. ? More fluid or blood. ? Warmth. ? Pus or a bad smell.  Wear loose, soft clothing while your incisions heal. Driving  Do not drive or use heavy machinery while taking prescription pain medicine.  Do not drive for 24 hours if you were given a medicine to help you relax (sedative) during your procedure. Activity  Do not lift anything that is heavier than 10 lb (4.5 kg), or the limit that you are told, until your health care provider says that it is safe.  Ask your health care provider what activities are safe for you.A lot of activity during the first week after surgery can increase pain and swelling. For 1 week after your procedure: ? Avoid activities that take a lot of effort, such as exercise or sports. ? You may walk and climb stairs as needed for daily activity, but avoid long walks or climbing stairs for exercise. Managing pain and swelling   Put ice on painful or swollen areas: ? Put ice in a plastic bag. ? Place a towel between your skin and the bag. ? Leave the ice on for 20 minutes, 2-3 times a day. General instructions  Do not take baths, swim, or use a hot tub until your health care provider approves. Ask your health care provider if you may take showers. You may only be allowed to take sponge baths.  Take over-the-counter and prescription medicines only as told  by your health care provider.  To prevent or treat constipation while you are taking prescription pain medicine, your health care provider may recommend that you: ? Drink enough fluid to keep your urine pale yellow. ? Take over-the-counter or prescription medicines. ? Eat foods that are high in fiber, such as fresh fruits and vegetables, whole grains, and beans. ? Limit foods that are high in fat and processed sugars, such as fried and sweet foods.  Do not use any products that contain nicotine or tobacco, such  as cigarettes and e-cigarettes. If you need help quitting, ask your health care provider.  Drink enough fluid to keep your urine pale yellow.  Keep all follow-up visits as told by your health care provider. This is important. Contact a health care provider if:  You have more redness, swelling, or pain around your incisions or your groin area.  You have more swelling in your scrotum.  You have more fluid or blood coming from your incisions.  Your incisions feel warm to the touch.  You have severe pain and medicines do not help.  You have abdominal pain or swelling.  You cannot eat or drink without vomiting.  You cannot urinate or pass a bowel movement.  You faint.  You feel dizzy.  You have nausea and vomiting.  You have a fever. Get help right away if:  You have pus or a bad smell coming from your incisions.  You have chest pain.  You have problems breathing. Summary  Pain, swelling, and bruising are common after the procedure.  Check your incision area every day for signs of infection, such as more redness, swelling, or pain.  Put ice on painful or swollen areas for 20 minutes, 2-3 times a day. This information is not intended to replace advice given to you by your health care provider. Make sure you discuss any questions you have with your health care provider. Document Released: 08/15/2016 Document Revised: 08/15/2016 Document Reviewed: 08/15/2016 Elsevier Interactive Patient Education  2019 [place].         Please contact your physician with any problems or Same Day Surgery at [phone], Monday through Friday 6 am to 4 pm, or [place] [facility] number at [phone]. Diet: Resume home heart healthy regular diet.   Activity: No heavy lifting >20 pounds (children, pets, laundry, garbage) or strenuous activity until follow-up, but light activity and walking are encouraged. Do not drive or drink alcohol if taking narcotic pain  medications.  Wound care: May shower with soapy water and pat dry (do not rub incisions), but no baths or submerging incision underwater until follow-up. (no swimming)   Medications: Resume all home medications. For mild to moderate pain: acetaminophen (Tylenol) or ibuprofen (if no kidney disease). Combining Tylenol with alcohol can substantially increase your risk of causing liver disease. Narcotic pain medications, if prescribed, can be used for severe pain, though may cause nausea, constipation, and drowsiness. Do not combine Tylenol and Norco within a 6 hour period as Norco contains Tylenol. If you do not need the narcotic pain medication, you do not need to fill the prescription.  Call office [phone]) at any time if any questions, worsening pain, fevers/chills, bleeding, drainage from incision site, or other concerns.

## 2018-06-17 NOTE — Anesthesia Preprocedure Evaluation (Signed)
Anesthesia Evaluation  Patient identified by MRN, date of birth, ID band Patient awake    Reviewed: Allergy & Precautions, NPO status , Patient's Chart, lab work & pertinent test results  History of Anesthesia Complications Negative for: history of anesthetic complications  Airway Mallampati: III  TM Distance: <3 FB Neck ROM: Full    Dental no notable dental hx.    Pulmonary neg sleep apnea, neg COPD, former smoker,    breath sounds clear to auscultation- rhonchi (-) wheezing      Cardiovascular Exercise Tolerance: Good (-) hypertension(-) CAD, (-) Past MI, (-) Cardiac Stents and (-) CABG  Rhythm:Regular Rate:Normal - Systolic murmurs and - Diastolic murmurs    Neuro/Psych neg Seizures negative neurological ROS  negative psych ROS   GI/Hepatic negative GI ROS, Neg liver ROS,   Endo/Other  negative endocrine ROSneg diabetes  Renal/GU negative Renal ROS     Musculoskeletal  (+) Arthritis ,   Abdominal (+) - obese,   Peds  Hematology   Anesthesia Other Findings Past Medical History: No date: Arthritis     Comment:  hands   Reproductive/Obstetrics                             Anesthesia Physical Anesthesia Plan  ASA: II  Anesthesia Plan: General   Post-op Pain Management:    Induction: Intravenous  PONV Risk Score and Plan: 1 and Ondansetron and Dexamethasone  Airway Management Planned: Oral ETT and Video Laryngoscope Planned  Additional Equipment:   Intra-op Plan:   Post-operative Plan: Extubation in OR  Informed Consent: I have reviewed the patients History and Physical, chart, labs and discussed the procedure including the risks, benefits and alternatives for the proposed anesthesia with the patient or authorized representative who has indicated his/her understanding and acceptance.     Dental advisory given  Plan Discussed with: CRNA and Anesthesiologist  Anesthesia  Plan Comments:         Anesthesia Quick Evaluation

## 2018-06-18 LAB — SURGICAL PATHOLOGY

## 2018-11-10 DIAGNOSIS — Z96653 Presence of artificial knee joint, bilateral: Secondary | ICD-10-CM | POA: Diagnosis not present

## 2018-11-10 DIAGNOSIS — Z6823 Body mass index (BMI) 23.0-23.9, adult: Secondary | ICD-10-CM | POA: Diagnosis not present

## 2018-11-10 DIAGNOSIS — Z87891 Personal history of nicotine dependence: Secondary | ICD-10-CM | POA: Diagnosis not present

## 2018-12-30 DIAGNOSIS — Z720 Tobacco use: Secondary | ICD-10-CM | POA: Diagnosis not present

## 2018-12-30 DIAGNOSIS — R413 Other amnesia: Secondary | ICD-10-CM | POA: Diagnosis not present

## 2018-12-30 DIAGNOSIS — Z0000 Encounter for general adult medical examination without abnormal findings: Secondary | ICD-10-CM | POA: Diagnosis not present

## 2018-12-30 DIAGNOSIS — R634 Abnormal weight loss: Secondary | ICD-10-CM | POA: Diagnosis not present

## 2018-12-30 DIAGNOSIS — Z125 Encounter for screening for malignant neoplasm of prostate: Secondary | ICD-10-CM | POA: Diagnosis not present

## 2018-12-30 DIAGNOSIS — F17200 Nicotine dependence, unspecified, uncomplicated: Secondary | ICD-10-CM | POA: Diagnosis not present

## 2018-12-30 DIAGNOSIS — Z79899 Other long term (current) drug therapy: Secondary | ICD-10-CM | POA: Diagnosis not present

## 2018-12-30 DIAGNOSIS — E559 Vitamin D deficiency, unspecified: Secondary | ICD-10-CM | POA: Diagnosis not present

## 2018-12-30 DIAGNOSIS — Z1211 Encounter for screening for malignant neoplasm of colon: Secondary | ICD-10-CM | POA: Diagnosis not present

## 2019-01-13 DIAGNOSIS — Z1211 Encounter for screening for malignant neoplasm of colon: Secondary | ICD-10-CM | POA: Diagnosis not present

## 2019-02-05 DIAGNOSIS — Z23 Encounter for immunization: Secondary | ICD-10-CM | POA: Diagnosis not present

## 2019-03-25 DIAGNOSIS — H35371 Puckering of macula, right eye: Secondary | ICD-10-CM | POA: Diagnosis not present

## 2019-04-01 DIAGNOSIS — R413 Other amnesia: Secondary | ICD-10-CM | POA: Diagnosis not present

## 2019-04-01 DIAGNOSIS — R238 Other skin changes: Secondary | ICD-10-CM | POA: Diagnosis not present

## 2019-04-01 DIAGNOSIS — E559 Vitamin D deficiency, unspecified: Secondary | ICD-10-CM | POA: Diagnosis not present

## 2019-04-01 DIAGNOSIS — F17210 Nicotine dependence, cigarettes, uncomplicated: Secondary | ICD-10-CM | POA: Diagnosis not present

## 2019-05-24 DIAGNOSIS — H2511 Age-related nuclear cataract, right eye: Secondary | ICD-10-CM | POA: Diagnosis not present

## 2019-05-24 DIAGNOSIS — G309 Alzheimer's disease, unspecified: Secondary | ICD-10-CM | POA: Diagnosis not present

## 2019-06-01 ENCOUNTER — Other Ambulatory Visit: Payer: Self-pay

## 2019-06-01 ENCOUNTER — Encounter: Payer: Self-pay | Admitting: Ophthalmology

## 2019-06-07 ENCOUNTER — Other Ambulatory Visit
Admission: RE | Admit: 2019-06-07 | Discharge: 2019-06-07 | Disposition: A | Discharge status: Home / Self Care | Payer: Medicare HMO | Source: Ambulatory Visit | Attending: Ophthalmology | Admitting: Ophthalmology

## 2019-06-07 DIAGNOSIS — Z20822 Contact with and (suspected) exposure to covid-19: Secondary | ICD-10-CM | POA: Diagnosis not present

## 2019-06-07 DIAGNOSIS — Z01812 Encounter for preprocedural laboratory examination: Secondary | ICD-10-CM | POA: Insufficient documentation

## 2019-06-07 LAB — SARS CORONAVIRUS 2 (TAT 6-24 HRS): SARS Coronavirus 2: NEGATIVE

## 2019-06-08 NOTE — Discharge Instructions (Signed)

## 2019-06-09 ENCOUNTER — Other Ambulatory Visit: Payer: Self-pay

## 2019-06-09 ENCOUNTER — Ambulatory Visit: Payer: Medicare HMO | Admitting: Anesthesiology

## 2019-06-09 ENCOUNTER — Ambulatory Visit
Admission: RE | Admit: 2019-06-09 | Discharge: 2019-06-09 | Disposition: A | Discharge status: Home / Self Care | Payer: Medicare HMO | Attending: Ophthalmology | Admitting: Ophthalmology

## 2019-06-09 ENCOUNTER — Encounter: Payer: Self-pay | Admitting: Ophthalmology

## 2019-06-09 ENCOUNTER — Encounter
Admission: RE | Disposition: A | Discharge status: Home / Self Care | Payer: Self-pay | Source: Home / Self Care | Attending: Ophthalmology

## 2019-06-09 DIAGNOSIS — F0390 Unspecified dementia without behavioral disturbance: Secondary | ICD-10-CM | POA: Diagnosis not present

## 2019-06-09 DIAGNOSIS — Z96653 Presence of artificial knee joint, bilateral: Secondary | ICD-10-CM | POA: Insufficient documentation

## 2019-06-09 DIAGNOSIS — H25811 Combined forms of age-related cataract, right eye: Secondary | ICD-10-CM | POA: Diagnosis not present

## 2019-06-09 DIAGNOSIS — Z79899 Other long term (current) drug therapy: Secondary | ICD-10-CM | POA: Diagnosis not present

## 2019-06-09 DIAGNOSIS — H2511 Age-related nuclear cataract, right eye: Secondary | ICD-10-CM | POA: Diagnosis not present

## 2019-06-09 HISTORY — PX: CATARACT EXTRACTION W/PHACO: SHX586

## 2019-06-09 HISTORY — DX: Other amnesia: R41.3

## 2019-06-09 SURGERY — PHACOEMULSIFICATION, CATARACT, WITH IOL INSERTION
Anesthesia: Monitor Anesthesia Care | Site: Eye | Laterality: Right

## 2019-06-09 MED ORDER — LACTATED RINGERS IV SOLN: 100.0000 mL/h | INTRAVENOUS | Status: DC

## 2019-06-09 MED ORDER — MOXIFLOXACIN HCL 0.5 % OP SOLN
1.0000 [drp] | OPHTHALMIC | Status: DC | PRN
  Administered 2019-06-09 (×3): 1 [drp] via OPHTHALMIC

## 2019-06-09 MED ORDER — LACTATED RINGERS IV SOLN: INTRAVENOUS | Status: DC

## 2019-06-09 MED ORDER — CEFUROXIME OPHTHALMIC INJECTION 1 MG/0.1 ML
INJECTION | OPHTHALMIC | Status: DC | PRN
  Administered 2019-06-09: 0.1 mL via INTRACAMERAL

## 2019-06-09 MED ORDER — FENTANYL CITRATE (PF) 100 MCG/2ML IJ SOLN
INTRAMUSCULAR | Status: DC | PRN
  Administered 2019-06-09: 50 ug via INTRAVENOUS

## 2019-06-09 MED ORDER — TETRACAINE HCL 0.5 % OP SOLN
1.0000 [drp] | OPHTHALMIC | Status: DC | PRN
  Administered 2019-06-09 (×3): 1 [drp] via OPHTHALMIC

## 2019-06-09 MED ORDER — EPINEPHRINE PF 1 MG/ML IJ SOLN
INTRAOCULAR | Status: DC | PRN
  Administered 2019-06-09: 60 mL via OPHTHALMIC

## 2019-06-09 MED ORDER — NA HYALUR & NA CHOND-NA HYALUR 0.4-0.35 ML IO KIT
PACK | INTRAOCULAR | Status: DC | PRN
  Administered 2019-06-09: 1 mL via INTRAOCULAR

## 2019-06-09 MED ORDER — ARMC OPHTHALMIC DILATING DROPS
1.0000 "application " | OPHTHALMIC | Status: DC | PRN
  Administered 2019-06-09 (×3): 1 via OPHTHALMIC

## 2019-06-09 MED ORDER — BRIMONIDINE TARTRATE-TIMOLOL 0.2-0.5 % OP SOLN
OPHTHALMIC | Status: DC | PRN
  Administered 2019-06-09: 1 [drp] via OPHTHALMIC

## 2019-06-09 MED ORDER — LIDOCAINE HCL (PF) 2 % IJ SOLN
INTRAOCULAR | Status: DC | PRN
  Administered 2019-06-09: 1 mL

## 2019-06-09 SURGICAL SUPPLY — 16 items
CANNULA ANT/CHMB 27G (MISCELLANEOUS) ×1 IMPLANT
CANNULA ANT/CHMB 27GA (MISCELLANEOUS) ×3 IMPLANT
GLOVE SURG LX 7.5 STRW (GLOVE) ×2
GLOVE SURG LX STRL 7.5 STRW (GLOVE) ×1 IMPLANT
GLOVE SURG TRIUMPH 8.0 PF LTX (GLOVE) ×3 IMPLANT
GOWN STRL REUS W/ TWL LRG LVL3 (GOWN DISPOSABLE) ×2 IMPLANT
GOWN STRL REUS W/TWL LRG LVL3 (GOWN DISPOSABLE) ×4
LENS IOL TECNIS ITEC 19.5 (Intraocular Lens) ×2 IMPLANT
MARKER SKIN DUAL TIP RULER LAB (MISCELLANEOUS) ×3 IMPLANT
PACK CATARACT BRASINGTON (MISCELLANEOUS) ×3 IMPLANT
PACK EYE AFTER SURG (MISCELLANEOUS) ×3 IMPLANT
PACK OPTHALMIC (MISCELLANEOUS) ×3 IMPLANT
SYR 3ML LL SCALE MARK (SYRINGE) ×3 IMPLANT
SYR TB 1ML LUER SLIP (SYRINGE) ×3 IMPLANT
WATER STERILE IRR 500ML POUR (IV SOLUTION) ×3 IMPLANT
WIPE NON LINTING 3.25X3.25 (MISCELLANEOUS) ×3 IMPLANT

## 2019-06-09 NOTE — Op Note (Signed)
LOCATION:  [place]   PREOPERATIVE DIAGNOSIS:    Nuclear sclerotic cataract right eye. H25.11   POSTOPERATIVE DIAGNOSIS:  Nuclear sclerotic cataract right eye.     PROCEDURE:  Phacoemusification with posterior chamber intraocular lens placement of the right eye   ULTRASOUND TIME: Procedure(s): CATARACT EXTRACTION PHACO AND INTRAOCULAR LENS PLACEMENT (IOC) RIGHT 5.90  00:55.7  10.7% (Right)  LENS:   Implant Name Type Inv. Item Serial No. Manufacturer Lot No. LRB No. Used Action  LENS IOL DIOP 19.5 - [id number] Intraocular Lens LENS IOL DIOP 19.5 [id number]  Right 1 Implanted    [id number]     SURGEON:  Wyonia Hough, MD   ANESTHESIA:  Topical with tetracaine drops and 2% Xylocaine jelly, augmented with 1% preservative-free intracameral lidocaine.    COMPLICATIONS:  None.   DESCRIPTION OF PROCEDURE:  The patient was identified in the holding room and transported to the operating room and placed in the supine position under the operating microscope.  The right eye was identified as the operative eye and it was prepped and draped in the usual sterile ophthalmic fashion.   A 1 millimeter clear-corneal paracentesis was made at the 12:00 position.  0.5 ml of preservative-free 1% lidocaine was injected into the anterior chamber. The anterior chamber was filled with Viscoat viscoelastic.  A 2.4 millimeter keratome was used to make a near-clear corneal incision at the 9:00 position.  A curvilinear capsulorrhexis was made with a cystotome and capsulorrhexis forceps.  Balanced salt solution was used to hydrodissect and hydrodelineate the nucleus.   Phacoemulsification was then used in stop and chop fashion to remove the lens nucleus and epinucleus.  The remaining cortex was then removed using the irrigation and aspiration handpiece. Provisc was then placed into the capsular bag to distend it for lens placement.  A lens was then injected into the capsular bag.  The remaining  viscoelastic was aspirated.   Wounds were hydrated with balanced salt solution.  The anterior chamber was inflated to a physiologic pressure with balanced salt solution.  No wound leaks were noted. Cefuroxime 0.1 ml of a 10mg /ml solution was injected into the anterior chamber for a dose of 1 mg of intracameral antibiotic at the completion of the case.   Timolol and Brimonidine drops were applied to the eye.  The patient was taken to the recovery room in stable condition without complications of anesthesia or surgery.   Shanoah Asbill 06/09/2019, 9:59 AM

## 2019-06-09 NOTE — Transfer of Care (Signed)
Immediate Anesthesia Transfer of Care Note  Patient: Jesus Scott  Procedure(s) Performed: CATARACT EXTRACTION PHACO AND INTRAOCULAR LENS PLACEMENT (IOC) RIGHT 5.90  00:55.7  10.7% (Right Eye)  Patient Location: PACU  Anesthesia Type: MAC  Level of Consciousness: awake, alert  and patient cooperative  Airway and Oxygen Therapy: Patient Spontanous Breathing and Patient connected to supplemental oxygen  Post-op Assessment: Post-op Vital signs reviewed, Patient's Cardiovascular Status Stable, Respiratory Function Stable, Patent Airway and No signs of Nausea or vomiting  Post-op Vital Signs: Reviewed and stable  Complications: No apparent anesthesia complications

## 2019-06-09 NOTE — Anesthesia Postprocedure Evaluation (Signed)
Anesthesia Post Note  Patient: Jesus Scott  Procedure(s) Performed: CATARACT EXTRACTION PHACO AND INTRAOCULAR LENS PLACEMENT (IOC) RIGHT 5.90  00:55.7  10.7% (Right Eye)     Patient location during evaluation: PACU Anesthesia Type: MAC Level of consciousness: awake and alert Pain management: pain level controlled Vital Signs Assessment: post-procedure vital signs reviewed and stable Respiratory status: spontaneous breathing, nonlabored ventilation, respiratory function stable and patient connected to nasal cannula oxygen Cardiovascular status: stable and blood pressure returned to baseline Postop Assessment: no apparent nausea or vomiting Anesthetic complications: no    Takiesha Mcdevitt

## 2019-06-09 NOTE — H&P (Signed)

## 2019-06-09 NOTE — Anesthesia Preprocedure Evaluation (Signed)
  Anesthesia Plan  ASA: II  Anesthesia Plan: MAC   Post-op Pain Management:    Induction:   PONV Risk Score and Plan: 1 and Midazolam  Airway Management Planned:   Additional Equipment:   Intra-op Plan:   Post-operative Plan:   Informed Consent: I have reviewed the patients History and Physical, chart, labs and discussed the procedure including the risks, benefits and alternatives for the proposed anesthesia with the patient or authorized representative who has indicated his/her understanding and acceptance.       Plan Discussed with: CRNA  Anesthesia Plan Comments:         Anesthesia Quick Evaluation

## 2019-06-10 ENCOUNTER — Encounter: Payer: Self-pay | Admitting: *Deleted

## 2019-06-21 DIAGNOSIS — R4189 Other symptoms and signs involving cognitive functions and awareness: Secondary | ICD-10-CM | POA: Diagnosis not present

## 2019-06-21 DIAGNOSIS — G3184 Mild cognitive impairment, so stated: Secondary | ICD-10-CM | POA: Diagnosis not present

## 2019-06-21 DIAGNOSIS — E559 Vitamin D deficiency, unspecified: Secondary | ICD-10-CM | POA: Diagnosis not present

## 2019-06-21 DIAGNOSIS — F5101 Primary insomnia: Secondary | ICD-10-CM | POA: Diagnosis not present

## 2019-06-21 DIAGNOSIS — E538 Deficiency of other specified B group vitamins: Secondary | ICD-10-CM | POA: Diagnosis not present

## 2019-06-21 DIAGNOSIS — R0683 Snoring: Secondary | ICD-10-CM | POA: Diagnosis not present

## 2019-06-21 DIAGNOSIS — E519 Thiamine deficiency, unspecified: Secondary | ICD-10-CM | POA: Diagnosis not present

## 2019-06-24 ENCOUNTER — Other Ambulatory Visit: Payer: Self-pay | Admitting: Neurology

## 2019-06-24 ENCOUNTER — Other Ambulatory Visit (HOSPITAL_COMMUNITY): Payer: Self-pay | Admitting: Neurology

## 2019-06-24 DIAGNOSIS — R4189 Other symptoms and signs involving cognitive functions and awareness: Secondary | ICD-10-CM

## 2019-07-02 DIAGNOSIS — Z87891 Personal history of nicotine dependence: Secondary | ICD-10-CM | POA: Diagnosis not present

## 2019-07-02 DIAGNOSIS — R413 Other amnesia: Secondary | ICD-10-CM | POA: Diagnosis not present

## 2019-07-10 ENCOUNTER — Ambulatory Visit (HOSPITAL_COMMUNITY)
Admission: RE | Admit: 2019-07-10 | Discharge: 2019-07-10 | Disposition: A | Discharge status: Home / Self Care | Payer: Medicare HMO | Source: Ambulatory Visit | Attending: Neurology | Admitting: Neurology

## 2019-07-10 ENCOUNTER — Other Ambulatory Visit: Payer: Self-pay

## 2019-07-10 DIAGNOSIS — R930 Abnormal findings on diagnostic imaging of skull and head, not elsewhere classified: Secondary | ICD-10-CM | POA: Diagnosis not present

## 2019-07-10 DIAGNOSIS — G3184 Mild cognitive impairment, so stated: Secondary | ICD-10-CM | POA: Diagnosis not present

## 2019-07-10 DIAGNOSIS — R4189 Other symptoms and signs involving cognitive functions and awareness: Secondary | ICD-10-CM | POA: Diagnosis not present

## 2019-07-14 ENCOUNTER — Ambulatory Visit (HOSPITAL_COMMUNITY): Payer: Medicare HMO

## 2019-08-10 DIAGNOSIS — Z0100 Encounter for examination of eyes and vision without abnormal findings: Secondary | ICD-10-CM | POA: Diagnosis not present

## 2019-09-28 DIAGNOSIS — E538 Deficiency of other specified B group vitamins: Secondary | ICD-10-CM | POA: Diagnosis not present

## 2019-09-28 DIAGNOSIS — Z79899 Other long term (current) drug therapy: Secondary | ICD-10-CM | POA: Diagnosis not present

## 2019-10-05 DIAGNOSIS — R413 Other amnesia: Secondary | ICD-10-CM | POA: Diagnosis not present

## 2019-10-05 DIAGNOSIS — Z0000 Encounter for general adult medical examination without abnormal findings: Secondary | ICD-10-CM | POA: Diagnosis not present

## 2019-10-05 DIAGNOSIS — Z87891 Personal history of nicotine dependence: Secondary | ICD-10-CM | POA: Diagnosis not present

## 2019-10-19 DIAGNOSIS — R0683 Snoring: Secondary | ICD-10-CM | POA: Diagnosis not present

## 2019-10-19 DIAGNOSIS — F5101 Primary insomnia: Secondary | ICD-10-CM | POA: Diagnosis not present

## 2019-10-19 DIAGNOSIS — G3184 Mild cognitive impairment, so stated: Secondary | ICD-10-CM | POA: Diagnosis not present

## 2020-01-11 DIAGNOSIS — R413 Other amnesia: Secondary | ICD-10-CM | POA: Diagnosis not present

## 2020-01-20 DIAGNOSIS — H2512 Age-related nuclear cataract, left eye: Secondary | ICD-10-CM | POA: Diagnosis not present

## 2020-02-22 DIAGNOSIS — F0280 Dementia in other diseases classified elsewhere without behavioral disturbance: Secondary | ICD-10-CM | POA: Diagnosis not present

## 2020-02-22 DIAGNOSIS — G301 Alzheimer's disease with late onset: Secondary | ICD-10-CM | POA: Diagnosis not present

## 2020-02-22 DIAGNOSIS — E538 Deficiency of other specified B group vitamins: Secondary | ICD-10-CM | POA: Diagnosis not present

## 2020-04-19 DIAGNOSIS — Z79899 Other long term (current) drug therapy: Secondary | ICD-10-CM | POA: Diagnosis not present

## 2020-04-19 DIAGNOSIS — R413 Other amnesia: Secondary | ICD-10-CM | POA: Diagnosis not present

## 2020-04-19 DIAGNOSIS — Z1211 Encounter for screening for malignant neoplasm of colon: Secondary | ICD-10-CM | POA: Diagnosis not present

## 2020-04-19 DIAGNOSIS — Z125 Encounter for screening for malignant neoplasm of prostate: Secondary | ICD-10-CM | POA: Diagnosis not present

## 2020-08-24 DIAGNOSIS — F0280 Dementia in other diseases classified elsewhere without behavioral disturbance: Secondary | ICD-10-CM | POA: Diagnosis not present

## 2020-08-24 DIAGNOSIS — G301 Alzheimer's disease with late onset: Secondary | ICD-10-CM | POA: Diagnosis not present

## 2020-08-24 DIAGNOSIS — F5101 Primary insomnia: Secondary | ICD-10-CM | POA: Diagnosis not present

## 2020-10-24 DIAGNOSIS — E559 Vitamin D deficiency, unspecified: Secondary | ICD-10-CM | POA: Diagnosis not present

## 2020-10-24 DIAGNOSIS — R413 Other amnesia: Secondary | ICD-10-CM | POA: Diagnosis not present

## 2020-10-24 DIAGNOSIS — Z79899 Other long term (current) drug therapy: Secondary | ICD-10-CM | POA: Diagnosis not present

## 2020-10-24 DIAGNOSIS — E538 Deficiency of other specified B group vitamins: Secondary | ICD-10-CM | POA: Diagnosis not present

## 2020-10-24 DIAGNOSIS — Z0000 Encounter for general adult medical examination without abnormal findings: Secondary | ICD-10-CM | POA: Diagnosis not present

## 2020-11-29 ENCOUNTER — Other Ambulatory Visit: Payer: Self-pay

## 2020-11-29 ENCOUNTER — Encounter: Payer: Self-pay | Admitting: Emergency Medicine

## 2020-11-29 ENCOUNTER — Ambulatory Visit
Admission: EM | Admit: 2020-11-29 | Discharge: 2020-11-29 | Disposition: A | Discharge status: Home / Self Care | Payer: Medicare HMO | Attending: Emergency Medicine | Admitting: Emergency Medicine

## 2020-11-29 DIAGNOSIS — U071 COVID-19: Secondary | ICD-10-CM

## 2020-11-29 MED ORDER — NIRMATRELVIR/RITONAVIR (PAXLOVID)TABLET: 3.0000 | ORAL_TABLET | Freq: Two times a day (BID) | ORAL | 0 refills | Status: AC

## 2020-11-29 NOTE — Discharge Instructions (Addendum)
Go to the emergency department if you have shortness of breath or other concerning symptoms.    Take the Paxlovid as directed.  Call your primary care provider to let them know that you are taking this medication.

## 2020-11-29 NOTE — ED Triage Notes (Signed)
Patient c/o nonproductive cough and fatigue x 1 day.   Patients wife denies fever at home. Patient denies SOB.   Patient reports a positive at home COVID test (today).   History of Dementia.   Patient has been given Aspirin with no relief of symptoms.

## 2020-11-29 NOTE — ED Provider Notes (Signed)
[facility]    CSN: [id number] Arrival date & time: 11/29/20  1403      History   Chief Complaint Chief Complaint  Patient presents with   Cough   Fatigue    HPI TAILOR WESTFALL is a 80 y.o. male.  Accompanied by his wife, patient presents with 1 day history of fatigue and nonproductive cough.  He tested COVID positive at home today.  No fever, rash, shortness of breath, diarrhea, or other symptoms.  Treatment at home with aspirin.  His medical history includes arthritis and memory loss.  The history is provided by the patient, the spouse and medical records.   Past Medical History:  Diagnosis Date   Arthritis    hands   Memory loss    mild    There are no problems to display for this patient.   Past Surgical History:  Procedure Laterality Date   CATARACT EXTRACTION W/PHACO Right 06/09/2019   Procedure: CATARACT EXTRACTION PHACO AND INTRAOCULAR LENS PLACEMENT (IOC) RIGHT 5.90  00:55.7  10.7%;  Surgeon: Leandrew Koyanagi, MD;  Location: [facility];  Service: Ophthalmology;  Laterality: Right;   COLONOSCOPY     INGUINAL HERNIA REPAIR Left 06/17/2018   Procedure: HERNIA REPAIR INGUINAL ADULT;  Surgeon: Herbert Pun, MD;  Location: [facility];  Service: General;  Laterality: Left;   JOINT REPLACEMENT Bilateral 2010, 2012   knee   TONSILLECTOMY         Home Medications    Prior to Admission medications   Medication Sig Start Date End Date Taking? Authorizing Provider  amitriptyline (ELAVIL) 25 MG tablet Take 25 mg by mouth at bedtime.   Yes [provider]  Apoaequorin 10 MG CAPS Take 10 mg by mouth daily.    Yes [provider]  Ascorbic Acid (VITAMIN C PO) Take by mouth daily.   Yes [provider]  donepezil (ARICEPT) 5 MG tablet Take 5 mg by mouth daily.   Yes [provider]  Multiple Vitamins-Minerals (AIRBORNE PO) Take by mouth daily.   Yes [provider]  nirmatrelvir/ritonavir EUA  (PAXLOVID) TABS Take 3 tablets by mouth 2 (two) times daily for 5 days. Patient GFR is 72. Take nirmatrelvir (150 mg) two tablets twice daily for 5 days and ritonavir (100 mg) one tablet twice daily for 5 days. 11/29/20 12/04/20 Yes Sharion Balloon, NP    Family History History reviewed. No pertinent family history.  Social History Social History   Tobacco Use   Smoking status: Former    Packs/day: 1.00    Years: 20.00    Pack years: 20.00    Types: Cigarettes    Quit date: 06/16/1998    Years since quitting: 22.4   Smokeless tobacco: Never   Tobacco comments:    may have occasional cig, none for several months  Vaping Use   Vaping Use: Never used  Substance Use Topics   Alcohol use: Yes    Alcohol/week: 1.0 standard drink    Types: 1 Standard drinks or equivalent per week    Comment: occ    Drug use: Never     Allergies   Patient has no known allergies.   Review of Systems Review of Systems  Constitutional:  Positive for fatigue. Negative for chills and fever.  HENT:  Negative for ear pain and sore throat.   Eyes:  Negative for pain and visual disturbance.  Respiratory:  Positive for cough. Negative for shortness of breath.   Cardiovascular:  Negative for chest pain and palpitations.  Gastrointestinal:  Negative for abdominal pain and vomiting.  Genitourinary:  Negative for dysuria and hematuria.  Musculoskeletal:  Negative for arthralgias and back pain.  Skin:  Negative for color change and rash.  Neurological:  Negative for seizures and syncope.  All other systems reviewed and are negative.   Physical Exam Triage Vital Signs ED Triage Vitals  Enc Vitals Group     BP      Pulse      Resp      Temp      Temp src      SpO2      Weight      Height      Head Circumference      Peak Flow      Pain Score      Pain Loc      Pain Edu?      Excl. in [place]?    No data found.  Updated Vital Signs BP 111/67 (BP Location: Left Arm)   Pulse 80   Temp 98.3 F  (36.8 C) (Oral)   Resp 18   SpO2 97%   Visual Acuity Right Eye Distance:   Left Eye Distance:   Bilateral Distance:    Right Eye Near:   Left Eye Near:    Bilateral Near:     Physical Exam Vitals and nursing note reviewed.  Constitutional:      General: He is not in acute distress.    Appearance: He is well-developed.  HENT:     Head: Normocephalic and atraumatic.     Right Ear: Tympanic membrane normal.     Left Ear: Tympanic membrane normal.     Mouth/Throat:     Mouth: Mucous membranes are moist.  Eyes:     Conjunctiva/sclera: Conjunctivae normal.  Cardiovascular:     Rate and Rhythm: Normal rate and regular rhythm.     Heart sounds: Normal heart sounds.  Pulmonary:     Effort: Pulmonary effort is normal. No respiratory distress.     Breath sounds: Normal breath sounds.  Abdominal:     Palpations: Abdomen is soft.     Tenderness: There is no abdominal tenderness.  Musculoskeletal:     Cervical back: Neck supple.  Skin:    General: Skin is warm and dry.  Neurological:     Mental Status: He is alert. Mental status is at baseline.  Psychiatric:        Mood and Affect: Mood normal.        Behavior: Behavior normal.     UC Treatments / Results  Labs (all labs ordered are listed, but only abnormal results are displayed) Labs Reviewed  NOVEL CORONAVIRUS, NAA    EKG   Radiology No results found.  Procedures Procedures (including critical care time)  Medications Ordered in UC Medications - No data to display  Initial Impression / Assessment and Plan / UC Course  I have reviewed the triage vital signs and the nursing notes.  Pertinent labs & imaging results that were available during my care of the patient were reviewed by me and considered in my medical decision making (see chart for details).  COVID-19.  Patient tested positive at home; PCR pending.  Last GFR 72 in June 2022.  Symptom onset yesterday.  Based on patient's age and medical history,  treating with Paxlovid.  I reviewed the side effects with patient and his wife at length.  Also discussed possibility for rebound  COVID symptoms after completing Paxlovid.  Instructed patient's wife to call his PCPs office to let them know that he has been prescribed this medication.  Strict ED precautions discussed.  Patient and his wife agree to plan of care.   Final Clinical Impressions(s) / UC Diagnoses   Final diagnoses:  [id number]     Discharge Instructions      Go to the emergency department if you have shortness of breath or other concerning symptoms.    Take the Paxlovid as directed.  Call your primary care provider to let them know that you are taking this medication.         ED Prescriptions     Medication Sig Dispense Auth. Provider   nirmatrelvir/ritonavir EUA (PAXLOVID) TABS Take 3 tablets by mouth 2 (two) times daily for 5 days. Patient GFR is 72. Take nirmatrelvir (150 mg) two tablets twice daily for 5 days and ritonavir (100 mg) one tablet twice daily for 5 days. 30 tablet Sharion Balloon, NP      PDMP not reviewed this encounter.   Sharion Balloon, NP 11/29/20 1525

## 2020-11-30 LAB — NOVEL CORONAVIRUS, NAA: SARS-CoV-2, NAA: DETECTED — AB

## 2020-11-30 LAB — SARS-COV-2, NAA 2 DAY TAT

## 2020-12-06 DIAGNOSIS — Z20822 Contact with and (suspected) exposure to covid-19: Secondary | ICD-10-CM | POA: Diagnosis not present

## 2020-12-06 DIAGNOSIS — Z03818 Encounter for observation for suspected exposure to other biological agents ruled out: Secondary | ICD-10-CM | POA: Diagnosis not present

## 2021-03-01 DIAGNOSIS — F5101 Primary insomnia: Secondary | ICD-10-CM | POA: Diagnosis not present

## 2021-03-01 DIAGNOSIS — G301 Alzheimer's disease with late onset: Secondary | ICD-10-CM | POA: Diagnosis not present

## 2021-03-01 DIAGNOSIS — F0280 Dementia in other diseases classified elsewhere without behavioral disturbance: Secondary | ICD-10-CM | POA: Diagnosis not present

## 2021-04-24 DIAGNOSIS — Z79899 Other long term (current) drug therapy: Secondary | ICD-10-CM | POA: Diagnosis not present

## 2021-04-24 DIAGNOSIS — R413 Other amnesia: Secondary | ICD-10-CM | POA: Diagnosis not present

## 2021-04-24 DIAGNOSIS — Z125 Encounter for screening for malignant neoplasm of prostate: Secondary | ICD-10-CM | POA: Diagnosis not present

## 2021-06-20 DIAGNOSIS — M25511 Pain in right shoulder: Secondary | ICD-10-CM | POA: Diagnosis not present

## 2021-06-20 DIAGNOSIS — D1721 Benign lipomatous neoplasm of skin and subcutaneous tissue of right arm: Secondary | ICD-10-CM | POA: Diagnosis not present

## 2021-06-21 ENCOUNTER — Other Ambulatory Visit: Payer: Self-pay | Admitting: Physician Assistant

## 2021-06-21 DIAGNOSIS — M25511 Pain in right shoulder: Secondary | ICD-10-CM

## 2021-06-21 DIAGNOSIS — D1721 Benign lipomatous neoplasm of skin and subcutaneous tissue of right arm: Secondary | ICD-10-CM

## 2021-07-02 ENCOUNTER — Other Ambulatory Visit: Payer: Self-pay

## 2021-07-02 ENCOUNTER — Ambulatory Visit
Admission: RE | Admit: 2021-07-02 | Discharge: 2021-07-02 | Disposition: A | Discharge status: Home / Self Care | Payer: Medicare HMO | Source: Ambulatory Visit | Attending: Physician Assistant | Admitting: Physician Assistant

## 2021-07-02 DIAGNOSIS — D1721 Benign lipomatous neoplasm of skin and subcutaneous tissue of right arm: Secondary | ICD-10-CM | POA: Diagnosis not present

## 2021-07-02 DIAGNOSIS — M25511 Pain in right shoulder: Secondary | ICD-10-CM | POA: Insufficient documentation

## 2021-07-02 DIAGNOSIS — R2231 Localized swelling, mass and lump, right upper limb: Secondary | ICD-10-CM | POA: Diagnosis not present

## 2021-09-21 DIAGNOSIS — F0150 Vascular dementia without behavioral disturbance: Secondary | ICD-10-CM | POA: Diagnosis not present

## 2021-09-21 DIAGNOSIS — H9193 Unspecified hearing loss, bilateral: Secondary | ICD-10-CM | POA: Diagnosis not present

## 2021-09-21 DIAGNOSIS — G4700 Insomnia, unspecified: Secondary | ICD-10-CM | POA: Diagnosis not present

## 2021-09-21 DIAGNOSIS — G309 Alzheimer's disease, unspecified: Secondary | ICD-10-CM | POA: Diagnosis not present

## 2021-09-21 DIAGNOSIS — F0280 Dementia in other diseases classified elsewhere without behavioral disturbance: Secondary | ICD-10-CM | POA: Diagnosis not present

## 2021-09-21 DIAGNOSIS — R0683 Snoring: Secondary | ICD-10-CM | POA: Diagnosis not present

## 2021-09-21 DIAGNOSIS — F01B0 Vascular dementia, moderate, without behavioral disturbance, psychotic disturbance, mood disturbance, and anxiety: Secondary | ICD-10-CM | POA: Diagnosis not present

## 2021-10-25 DIAGNOSIS — Z79899 Other long term (current) drug therapy: Secondary | ICD-10-CM | POA: Diagnosis not present

## 2021-10-25 DIAGNOSIS — Z0000 Encounter for general adult medical examination without abnormal findings: Secondary | ICD-10-CM | POA: Diagnosis not present

## 2021-10-25 DIAGNOSIS — E559 Vitamin D deficiency, unspecified: Secondary | ICD-10-CM | POA: Diagnosis not present

## 2021-10-25 DIAGNOSIS — F0390 Unspecified dementia without behavioral disturbance: Secondary | ICD-10-CM | POA: Diagnosis not present

## 2021-10-29 DIAGNOSIS — Z111 Encounter for screening for respiratory tuberculosis: Secondary | ICD-10-CM | POA: Diagnosis not present

## 2022-04-26 DIAGNOSIS — R413 Other amnesia: Secondary | ICD-10-CM | POA: Diagnosis not present

## 2022-04-26 DIAGNOSIS — Z79899 Other long term (current) drug therapy: Secondary | ICD-10-CM | POA: Diagnosis not present

## 2022-04-26 DIAGNOSIS — E538 Deficiency of other specified B group vitamins: Secondary | ICD-10-CM | POA: Diagnosis not present

## 2022-04-26 DIAGNOSIS — Z125 Encounter for screening for malignant neoplasm of prostate: Secondary | ICD-10-CM | POA: Diagnosis not present

## 2022-05-24 DIAGNOSIS — F411 Generalized anxiety disorder: Secondary | ICD-10-CM | POA: Diagnosis not present

## 2022-05-24 DIAGNOSIS — F02A18 Dementia in other diseases classified elsewhere, mild, with other behavioral disturbance: Secondary | ICD-10-CM | POA: Diagnosis not present

## 2022-05-24 DIAGNOSIS — F0150 Vascular dementia without behavioral disturbance: Secondary | ICD-10-CM | POA: Diagnosis not present

## 2022-05-24 DIAGNOSIS — F01A18 Vascular dementia, mild, with other behavioral disturbance: Secondary | ICD-10-CM | POA: Diagnosis not present

## 2022-05-24 DIAGNOSIS — F0280 Dementia in other diseases classified elsewhere without behavioral disturbance: Secondary | ICD-10-CM | POA: Diagnosis not present

## 2022-05-24 DIAGNOSIS — G301 Alzheimer's disease with late onset: Secondary | ICD-10-CM | POA: Diagnosis not present

## 2022-05-24 DIAGNOSIS — G309 Alzheimer's disease, unspecified: Secondary | ICD-10-CM | POA: Diagnosis not present

## 2022-05-24 DIAGNOSIS — G4700 Insomnia, unspecified: Secondary | ICD-10-CM | POA: Diagnosis not present

## 2022-05-30 ENCOUNTER — Emergency Department
Admission: EM | Admit: 2022-05-30 | Discharge: 2022-05-30 | Disposition: A | Discharge status: Home / Self Care | Payer: Medicare HMO | Attending: Emergency Medicine | Admitting: Emergency Medicine

## 2022-05-30 ENCOUNTER — Other Ambulatory Visit: Payer: Self-pay

## 2022-05-30 ENCOUNTER — Emergency Department: Payer: Medicare HMO

## 2022-05-30 DIAGNOSIS — M25532 Pain in left wrist: Secondary | ICD-10-CM | POA: Insufficient documentation

## 2022-05-30 DIAGNOSIS — Y9301 Activity, walking, marching and hiking: Secondary | ICD-10-CM | POA: Diagnosis not present

## 2022-05-30 DIAGNOSIS — S32010A Wedge compression fracture of first lumbar vertebra, initial encounter for closed fracture: Secondary | ICD-10-CM | POA: Diagnosis not present

## 2022-05-30 DIAGNOSIS — G319 Degenerative disease of nervous system, unspecified: Secondary | ICD-10-CM | POA: Diagnosis not present

## 2022-05-30 DIAGNOSIS — E041 Nontoxic single thyroid nodule: Secondary | ICD-10-CM | POA: Diagnosis not present

## 2022-05-30 DIAGNOSIS — F0390 Unspecified dementia without behavioral disturbance: Secondary | ICD-10-CM | POA: Diagnosis not present

## 2022-05-30 DIAGNOSIS — Y92019 Unspecified place in single-family (private) house as the place of occurrence of the external cause: Secondary | ICD-10-CM | POA: Insufficient documentation

## 2022-05-30 DIAGNOSIS — I6523 Occlusion and stenosis of bilateral carotid arteries: Secondary | ICD-10-CM | POA: Diagnosis not present

## 2022-05-30 DIAGNOSIS — W108XXA Fall (on) (from) other stairs and steps, initial encounter: Secondary | ICD-10-CM | POA: Insufficient documentation

## 2022-05-30 DIAGNOSIS — S0990XA Unspecified injury of head, initial encounter: Secondary | ICD-10-CM | POA: Insufficient documentation

## 2022-05-30 DIAGNOSIS — M47816 Spondylosis without myelopathy or radiculopathy, lumbar region: Secondary | ICD-10-CM | POA: Diagnosis not present

## 2022-05-30 DIAGNOSIS — S199XXA Unspecified injury of neck, initial encounter: Secondary | ICD-10-CM | POA: Diagnosis not present

## 2022-05-30 DIAGNOSIS — S3992XA Unspecified injury of lower back, initial encounter: Secondary | ICD-10-CM | POA: Diagnosis present

## 2022-05-30 DIAGNOSIS — J8410 Pulmonary fibrosis, unspecified: Secondary | ICD-10-CM | POA: Diagnosis not present

## 2022-05-30 DIAGNOSIS — W19XXXA Unspecified fall, initial encounter: Secondary | ICD-10-CM

## 2022-05-30 DIAGNOSIS — M4316 Spondylolisthesis, lumbar region: Secondary | ICD-10-CM | POA: Diagnosis not present

## 2022-05-30 MED ORDER — HYDROCODONE-ACETAMINOPHEN 5-325 MG PO TABS: 1.0000 | ORAL_TABLET | Freq: Four times a day (QID) | ORAL | 0 refills | Status: DC | PRN

## 2022-05-30 MED ORDER — ACETAMINOPHEN 325 MG PO TABS
650.0000 mg | ORAL_TABLET | Freq: Once | ORAL | Status: AC
  Administered 2022-05-30: 650 mg via ORAL
  Filled 2022-05-30: qty 2

## 2022-05-30 NOTE — ED Notes (Signed)
TLSO brace in place. Pt and wife verbalize understanding of discharge instructions. Opportunity for questioning and answers were provided. Pt discharged from ED to home with wife.

## 2022-05-30 NOTE — ED Notes (Signed)
LSO  BRACE  CALLED  FOR  INFORMED  KELLY  RN

## 2022-05-30 NOTE — ED Triage Notes (Signed)
Pt to ED via POV from home. Pt was going into garage from back steps and lost his footing and feel backwards. Fall was unwitnessed. Pt unsure if he hit his head. Pt denies blood thinners. Pt denies LOC. Pt reports lower back pain and left wrist pain. No recent falls. Pt has hx of dementia and doesn't remember how he fell

## 2022-05-30 NOTE — Discharge Instructions (Signed)
Follow-up with Dr. Nelly Laurence office.  Take Tylenol for pain as needed Wear the back brace when out of bed Take the narcotic pain medication only as needed  return if you are worsening

## 2022-05-30 NOTE — Progress Notes (Signed)
Orthopedic Tech Progress Note Patient Details:  Jesus Scott 1940-12-16 [id number]  Order for LSO quick draw brace called into [facility]. Due to pt's projected d/c status vs being admitted this was called in as a stat order. Patient ID: Jesus Scott, male   DOB: 05-Apr-1941, 82 y.o.   MRN: [id number]  Carin Primrose 05/30/2022, 7:27 PM

## 2022-05-30 NOTE — ED Provider Notes (Signed)
[facility] Provider Note    Event Date/Time   First MD Initiated Contact with Patient 05/30/22 1640     (approximate)   History   Fall   HPI  NYHEEM BINETTE is a 82 y.o. male presents emergency department after unwitnessed fall at home.  Patient was walking into the garage and fell backwards from the steps.  Unsure if he hit his head.  Patient does have history of dementia.  Is complaining of low back pain.  No vomiting since incident.  No numbness or tingling.  Also complains of left wrist pain      Physical Exam   Triage Vital Signs: ED Triage Vitals [05/30/22 1621]  Enc Vitals Group     BP (!) 140/75     Pulse Rate 80     Resp 18     Temp 97.9 F (36.6 C)     Temp Source Oral     SpO2 99 %     Weight      Height      Head Circumference      Peak Flow      Pain Score 5     Pain Loc      Pain Edu?      Excl. in [place]?     Most recent vital signs: Vitals:   05/30/22 1621  BP: (!) 140/75  Pulse: 80  Resp: 18  Temp: 97.9 F (36.6 C)  SpO2: 99%     General: Awake, no distress.   CV:  Good peripheral perfusion. regular rate and  rhythm Resp:  Normal effort. L Abd:  No distention.   Other:  Skull is nontender, C-spine mildly tender, lumbar spine and lower T-spine tender, 5 or 5 strength lower extremities, neurovascular intact, left wrist tender to palpation   ED Results / Procedures / Treatments   Labs (all labs ordered are listed, but only abnormal results are displayed) Labs Reviewed - No data to display   EKG     RADIOLOGY X-ray of the left wrist and lumbar spine CT of the head, C-spine, lumbar spine    PROCEDURES:   Procedures   MEDICATIONS ORDERED IN ED: Medications  acetaminophen (TYLENOL) tablet 650 mg (650 mg Oral Given 05/30/22 1734)     IMPRESSION / MDM / Export / ED COURSE  I reviewed the triage vital signs and the nursing notes.                              Differential  diagnosis includes, but is not limited to, fracture, contusion, strain, subdural, subarachnoid  Patient's presentation is most consistent with acute complicated illness / injury requiring diagnostic workup.   X-ray of the left wrist and lumbar spine independently reviewed interpreted by me.  Radiologist comments possible endplate fracture at L1.  Remainder is unremarkable  CT of the head, C-spine, lumbar spine due to trauma   CT of the head and C-spine independently reviewed and interpreted by me as being negative for any acute abnormality  CT of the lumbar spine independently reviewed and interpreted by me, radiologist comments L1 vertebral body fracture with 30% height loss that is acute  Consult to neurosurgery, Dr. Cari Caraway will follow-up in the office.  States to place him in a LSO brace.  He is to wear this when out of bed.  I did explain the findings to the patient and his wife.  They are to follow-up with Dr. Cari Caraway.  They are in agreement treatment plan.  The patient wants to go home and does not want to be admitted for pain control.  He has had Tylenol while here in the ED and seems to be comfortable.  I feel that discharge will be appropriate.   FINAL CLINICAL IMPRESSION(S) / ED DIAGNOSES   Final diagnoses:  Closed wedge compression fracture of L1 vertebra, initial encounter ([place])  Fall, initial encounter  Minor head injury, initial encounter     Rx / DC Orders   ED Discharge Orders          Ordered    HYDROcodone-acetaminophen (NORCO/VICODIN) 5-325 MG tablet  Every 6 hours PRN        05/30/22 1849             Note:  This document was prepared using Dragon voice recognition software and may include unintentional dictation errors.    Versie Starks, PA-C 05/30/22 1849    Vanessa Lares, MD 05/31/22 [phone]

## 2022-06-03 NOTE — Progress Notes (Signed)
Referring Physician:  Vanessa Bucyrus, MD [address] [place],  [place] [id number]  Primary Physician:  Idelle Crouch, MD  History of Present Illness: 06/13/2022 Mr. Jesus Scott has a history of dementia.    Seen in ED on 05/30/22 with L1 compression fracture s/p fall. ED spoke with Dr. Izora Ribas and he recommended LSO brace and outpatient follow up.   His wife helps with history when needed.   He has intermittent LBP with no leg pain. He is having more good days than bad. He is taking tylenol for pain. Only takes prn hydrocodone. No numbness, tingling, or weakness.   Given norco 5 by ED. He needs a refill.    Review of Systems:  A 10 point review of systems is negative, except for the pertinent positives and negatives detailed in the HPI.  Past Medical History: Past Medical History:  Diagnosis Date   Arthritis    hands   Memory loss    mild    Past Surgical History: Past Surgical History:  Procedure Laterality Date   CATARACT EXTRACTION W/PHACO Right 06/09/2019   Procedure: CATARACT EXTRACTION PHACO AND INTRAOCULAR LENS PLACEMENT (IOC) RIGHT 5.90  00:55.7  10.7%;  Surgeon: Leandrew Koyanagi, MD;  Location: [place];  Service: Ophthalmology;  Laterality: Right;   COLONOSCOPY     INGUINAL HERNIA REPAIR Left 06/17/2018   Procedure: HERNIA REPAIR INGUINAL ADULT;  Surgeon: Herbert Pun, MD;  Location: [facility];  Service: General;  Laterality: Left;   JOINT REPLACEMENT Bilateral 2010, 2012   knee   TONSILLECTOMY      Allergies: Allergies as of 06/13/2022   (No Known Allergies)    Medications: Outpatient Encounter Medications as of 06/13/2022  Medication Sig   amitriptyline (ELAVIL) 25 MG tablet Take 25 mg by mouth at bedtime.   Apoaequorin 10 MG CAPS Take 10 mg by mouth daily.    Ascorbic Acid (VITAMIN C PO) Take by mouth daily.   donepezil (ARICEPT) 5 MG tablet Take 5 mg by mouth daily.   HYDROcodone-acetaminophen (NORCO/VICODIN)  5-325 MG tablet Take 1 tablet by mouth every 6 (six) hours as needed for severe pain.   Multiple Vitamins-Minerals (AIRBORNE PO) Take by mouth daily.   No facility-administered encounter medications on file as of 06/13/2022.    Social History: Social History   Tobacco Use   Smoking status: Former    Packs/day: 1.00    Years: 20.00    Total pack years: 20.00    Types: Cigarettes    Quit date: 06/16/1998    Years since quitting: 24.0   Smokeless tobacco: Never   Tobacco comments:    may have occasional cig, none for several months  Vaping Use   Vaping Use: Never used  Substance Use Topics   Alcohol use: Yes    Alcohol/week: 1.0 standard drink of alcohol    Types: 1 Standard drinks or equivalent per week    Comment: occ    Drug use: Never    Family Medical History: History reviewed. No pertinent family history.  Physical Examination: Vitals:   06/13/22 1326  BP: 122/72    General: Patient is well developed, well nourished, calm, collected, and in no apparent distress. Attention to examination is appropriate.  Respiratory: Patient is breathing without any difficulty.   NEUROLOGICAL:     He is alert. Speech is clear and fluent. Fund of knowledge is appropriate.   Cranial Nerves: Pupils equal round and reactive to light.  Facial tone is  symmetric.    ROM of lumbar spine not tested.   No lumbar tenderness. No tenderness at L1.   No abnormal lesions on exposed skin.   Strength: Side Biceps Triceps Deltoid Interossei Grip Wrist Ext. Wrist Flex.  R '5 5 5 5 5 5 5  '$ L '5 5 5 5 5 5 5   '$ Side Iliopsoas Quads Hamstring PF DF EHL  R '5 5 5 5 5 5  '$ L '5 5 5 5 5 5   '$ Reflexes are 2+ and symmetric at the biceps, triceps, brachioradialis, patella and achilles.   Hoffman's is absent.  Clonus is not present.   Bilateral upper and lower extremity sensation is intact to light touch.     Gait is normal.     Medical Decision Making  Imaging: Xrays of lumbar spine dated  06/12/22:  Progression of L1 compression fracture compared to previous imaging. Slip at L4-L5.   No radiology report available. Above xrays reviewed with Dr. Izora Ribas.   CT of lumbar spine dated 05/30/22:  FINDINGS: Segmentation: Standard; the lowest formed disc space is designated L5-S1.   Alignment: There is grade 1 anterolisthesis of L4 on L5 and L5 on S1. There is no jumped or perched facet or other evidence of traumatic malalignment.   Vertebrae: There is an acute compression fracture of the L1 vertebral body with up to approximately 30% loss of vertebral body height centrally. Fracture planes involve the anterior, superior, and inferior endplates. There is also minimal bony retropulsion of the posterior cortex (5-50) without significant spinal canal stenosis. There is no extension into the posterior elements.   Paraspinal and other soft tissues: There is an exophytic right upper pole renal cyst requiring no specific imaging follow-up. There is calcified atherosclerotic plaque in the abdominal aorta. The paraspinal soft tissues are unremarkable.   Disc levels: There is multilevel disc space narrowing in the lumbar spine, most advanced at L4-L5 and L5-S1. There is advanced facet arthropathy at on the right at L2-L3, bilaterally at L4-L5, and on the left at L5-S1. There are mild disc bulges without evidence of high-grade spinal canal stenosis. There is no greater than mild neural foraminal stenosis in the lumbar spine.   IMPRESSION: 1. Acute compression fracture of the L1 vertebral body with up to approximately 30% loss of vertebral body height centrally and minimal bony retropulsion of the posterior cortex but no significant spinal canal stenosis. 2. Advanced facet arthropathy at L2-L3, L4-L5, and L5-S1 with grade 1 degenerative anterolisthesis of L4 on L5 and L5 on S1.     Electronically Signed   By: Valetta Mole M.D.   On: 05/30/2022 18:26        I have personally  reviewed the images and agree with the above interpretation.   Assessment and Plan: Mr. Dirk is a pleasant 82 y.o. male with known L1 compression fracture s/p fall on 05/30/22. He has intermittent LBP with no leg pain. No numbness, tingling, or weakness.   Xrays from today showed progression of L1 fracture in comparison to previous imaging.   Treatment options discussed with patient and following plan made:   - Xrays reviewed with patient and his wife. Discussed that fracture had worsened/progressed since his last imaging.  Discussed getting MRI to look into this further and/or to consider kyphoplasty. Pain is tolerable and he and his wife want to hold on further imaging. I agree with this.  - Continue with LSO brace. Reviewed proper use. No bending, twisting, or lifting.  -  Continue prn hydrocodone for severe pain. Reviewed dosing and side effects. PMP reviewed and is appropriate.  - Can take OTC tylenol as well. No more than '3000mg'$  tylenol per day. Reminded wife that there is tylenol in hydrocodone as well.  - If he gets worse with increased pain, leg pain, numbness, tingling, or weakness then would recommend MRI. Wife will call.  - Follow up in 4 weeks with repeat lumbar xrays.   I spent a total of 35 minutes in face-to-face and non-face-to-face activities related to this patient's care today including review of outside records, review of imaging, review of symptoms, physical exam, discussion of differential diagnosis, discussion of treatment options, and documentation.   Thank you for involving me in the care of this patient.   Geronimo Boot PA-C Dept. of Neurosurgery

## 2022-06-12 ENCOUNTER — Ambulatory Visit
Admission: RE | Admit: 2022-06-12 | Discharge: 2022-06-12 | Disposition: A | Discharge status: Home / Self Care | Payer: Medicare HMO | Attending: Orthopedic Surgery | Admitting: Orthopedic Surgery

## 2022-06-12 ENCOUNTER — Ambulatory Visit
Admission: RE | Admit: 2022-06-12 | Discharge: 2022-06-12 | Disposition: A | Discharge status: Home / Self Care | Payer: Medicare HMO | Source: Ambulatory Visit | Attending: Orthopedic Surgery | Admitting: Orthopedic Surgery

## 2022-06-12 ENCOUNTER — Other Ambulatory Visit: Payer: Self-pay | Admitting: Orthopedic Surgery

## 2022-06-12 DIAGNOSIS — S32010A Wedge compression fracture of first lumbar vertebra, initial encounter for closed fracture: Secondary | ICD-10-CM

## 2022-06-12 DIAGNOSIS — M4316 Spondylolisthesis, lumbar region: Secondary | ICD-10-CM | POA: Diagnosis not present

## 2022-06-13 ENCOUNTER — Ambulatory Visit: Payer: Medicare HMO | Admitting: Orthopedic Surgery

## 2022-06-13 ENCOUNTER — Encounter: Payer: Self-pay | Admitting: Orthopedic Surgery

## 2022-06-13 VITALS — BP 122/72 | Ht 74.0 in | Wt 172.2 lb

## 2022-06-13 DIAGNOSIS — S32010A Wedge compression fracture of first lumbar vertebra, initial encounter for closed fracture: Secondary | ICD-10-CM

## 2022-06-13 DIAGNOSIS — W19XXXA Unspecified fall, initial encounter: Secondary | ICD-10-CM | POA: Diagnosis not present

## 2022-06-13 MED ORDER — HYDROCODONE-ACETAMINOPHEN 5-325 MG PO TABS: 1.0000 | ORAL_TABLET | Freq: Three times a day (TID) | ORAL | 0 refills | Status: AC | PRN

## 2022-06-13 NOTE — Patient Instructions (Signed)
It was so nice to see you today. Thank you so much for coming in.    Your lower back xrays showed some worsening of the compression fracture at L1. This is likely causing your back pain.   If your pain gets worse or if you develop any leg pain/numbness/tingling then I recommend we get an MRI of your lower back to look into things further.   Wear your brace. No bending, twisting, or lifting. Do not wear brace to bed. Can remove if sitting and watching TV.   I sent a refill of hydrocodone to your pharmacy. Take only as needed for severe pain. Be sure you are not taking more than '3000mg'$  of tylenol per day (remember there is tylenol in the hydrocodone). Remember the hydrocodone can make you sleepy and/or constipated.   I will see you back in 4 weeks with repeat xrays (get prior to the visit, can do the day before). Please do not hesitate to call if you have any questions or concerns. You can also message me in [place].   If you have not heard back about any of the above things in the next week, please call the office so we can help you get them scheduled.   Geronimo Boot PA-C [phone]

## 2022-06-14 ENCOUNTER — Telehealth: Payer: Self-pay

## 2022-06-14 NOTE — Telephone Encounter (Signed)
-----  Message from Peggyann Shoals sent at 06/14/2022  1:16 PM EST ----- Regarding: order MRI Contact: [phone] Patient's wife Elzie Rings has noticed bilateral hand and arm shaking. Can you go ahead and order a MRI.

## 2022-06-14 NOTE — Telephone Encounter (Signed)
I spoke with Jesus Scott. She reports he woke up this morning and he was very shaky. He has been fine all day since. Per discussion with Marzetta Board, I encouraged her to contact Dr Doy Hutching about this.  She also inquired about the indications for a lumbar MRI. We discussed that this would be leg pain, numbness, weakness, tingling. She stated he is not having any of these symptoms at this time.

## 2022-06-18 DIAGNOSIS — R251 Tremor, unspecified: Secondary | ICD-10-CM | POA: Diagnosis not present

## 2022-06-18 DIAGNOSIS — R413 Other amnesia: Secondary | ICD-10-CM | POA: Diagnosis not present

## 2022-07-09 ENCOUNTER — Ambulatory Visit
Admission: RE | Admit: 2022-07-09 | Discharge: 2022-07-09 | Disposition: A | Discharge status: Home / Self Care | Payer: Medicare HMO | Attending: Orthopedic Surgery | Admitting: Orthopedic Surgery

## 2022-07-09 ENCOUNTER — Ambulatory Visit
Admission: RE | Admit: 2022-07-09 | Discharge: 2022-07-09 | Disposition: A | Discharge status: Home / Self Care | Payer: Medicare HMO | Source: Ambulatory Visit | Attending: Orthopedic Surgery | Admitting: Orthopedic Surgery

## 2022-07-09 DIAGNOSIS — M438X6 Other specified deforming dorsopathies, lumbar region: Secondary | ICD-10-CM | POA: Diagnosis not present

## 2022-07-09 DIAGNOSIS — M5136 Other intervertebral disc degeneration, lumbar region: Secondary | ICD-10-CM | POA: Diagnosis not present

## 2022-07-09 DIAGNOSIS — Z8739 Personal history of other diseases of the musculoskeletal system and connective tissue: Secondary | ICD-10-CM | POA: Diagnosis not present

## 2022-07-09 DIAGNOSIS — S32010A Wedge compression fracture of first lumbar vertebra, initial encounter for closed fracture: Secondary | ICD-10-CM | POA: Diagnosis not present

## 2022-07-09 DIAGNOSIS — M4316 Spondylolisthesis, lumbar region: Secondary | ICD-10-CM | POA: Diagnosis not present

## 2022-07-11 DIAGNOSIS — G309 Alzheimer's disease, unspecified: Secondary | ICD-10-CM | POA: Diagnosis not present

## 2022-07-11 DIAGNOSIS — F411 Generalized anxiety disorder: Secondary | ICD-10-CM | POA: Diagnosis not present

## 2022-07-11 DIAGNOSIS — F0150 Vascular dementia without behavioral disturbance: Secondary | ICD-10-CM | POA: Diagnosis not present

## 2022-07-11 DIAGNOSIS — F0280 Dementia in other diseases classified elsewhere without behavioral disturbance: Secondary | ICD-10-CM | POA: Diagnosis not present

## 2022-07-11 DIAGNOSIS — Z79899 Other long term (current) drug therapy: Secondary | ICD-10-CM | POA: Diagnosis not present

## 2022-07-14 NOTE — Progress Notes (Unsigned)
Referring Physician:  Idelle Crouch, MD [facility] [place],  [place] [address]  Primary Physician:  Idelle Crouch, MD  History of Present Illness: 07/14/2022 Mr. Zaiyan Camera has a history of dementia. His wife helps with history when needed.   Last seen by me on 06/13/22 for L1  compression fracture s/p fall on 05/30/22. He had intermittent LBP with no leg pain.   Xrays at last visit showed progression of L1 fracture in comparison to previous imaging. He was to continue with conservative treatment since he was doing well clinically.   He was to wear LSO brace. No bending, twisting, or lifting.   He is here for follow up.       Seen in ED on 05/30/22 with L1 compression fracture s/p fall. ED spoke with Dr. Izora Ribas and he recommended LSO brace and outpatient follow up.     He has intermittent LBP with no leg pain. He is having more good days than bad. He is taking tylenol for pain. Only takes prn hydrocodone. No numbness, tingling, or weakness.   Given norco 5 by ED. He needs a refill.    Review of Systems:  A 10 point review of systems is negative, except for the pertinent positives and negatives detailed in the HPI.  Past Medical History: Past Medical History:  Diagnosis Date   Arthritis    hands   Memory loss    mild    Past Surgical History: Past Surgical History:  Procedure Laterality Date   CATARACT EXTRACTION W/PHACO Right 06/09/2019   Procedure: CATARACT EXTRACTION PHACO AND INTRAOCULAR LENS PLACEMENT (IOC) RIGHT 5.90  00:55.7  10.7%;  Surgeon: Leandrew Koyanagi, MD;  Location: [facility];  Service: Ophthalmology;  Laterality: Right;   COLONOSCOPY     INGUINAL HERNIA REPAIR Left 06/17/2018   Procedure: HERNIA REPAIR INGUINAL ADULT;  Surgeon: Herbert Pun, MD;  Location: [facility];  Service: General;  Laterality: Left;   JOINT REPLACEMENT Bilateral 2010, 2012   knee   TONSILLECTOMY       Allergies: Allergies as of 07/16/2022   (No Known Allergies)    Medications: Outpatient Encounter Medications as of 07/16/2022  Medication Sig   amitriptyline (ELAVIL) 25 MG tablet Take 25 mg by mouth at bedtime.   Apoaequorin 10 MG CAPS Take 10 mg by mouth daily.    Ascorbic Acid (VITAMIN C PO) Take by mouth daily.   donepezil (ARICEPT) 5 MG tablet Take 5 mg by mouth daily.   HYDROcodone-acetaminophen (NORCO/VICODIN) 5-325 MG tablet Take 1 tablet by mouth every 8 (eight) hours as needed for severe pain.   Multiple Vitamins-Minerals (AIRBORNE PO) Take by mouth daily.   No facility-administered encounter medications on file as of 07/16/2022.    Social History: Social History   Tobacco Use   Smoking status: Former    Packs/day: 1.00    Years: 20.00    Total pack years: 20.00    Types: Cigarettes    Quit date: 06/16/1998    Years since quitting: 24.0   Smokeless tobacco: Never   Tobacco comments:    may have occasional cig, none for several months  Vaping Use   Vaping Use: Never used  Substance Use Topics   Alcohol use: Yes    Alcohol/week: 1.0 standard drink of alcohol    Types: 1 Standard drinks or equivalent per week    Comment: occ    Drug use: Never    Family Medical History: No  family history on file.  Physical Examination: There were no vitals filed for this visit.    He is alert. Speech is clear and fluent. Fund of knowledge is appropriate.   Cranial Nerves: Pupils equal round and reactive to light.  Facial tone is symmetric.    ROM of lumbar spine not tested.   No lumbar tenderness. No tenderness at L1.   No abnormal lesions on exposed skin.   Strength: Side Biceps Triceps Deltoid Interossei Grip Wrist Ext. Wrist Flex.  R '5 5 5 5 5 5 5  '$ L '5 5 5 5 5 5 5   '$ Side Iliopsoas Quads Hamstring PF DF EHL  R '5 5 5 5 5 5  '$ L '5 5 5 5 5 5   '$ Reflexes are 2+ and symmetric at the biceps, triceps, brachioradialis, patella and achilles.   Hoffman's is absent.   Clonus is not present.   Bilateral upper and lower extremity sensation is intact to light touch.     Gait is normal.     Medical Decision Making  Imaging: Xrays of lumbar spine dated 07/09/22: FINDINGS: Stable severe anterior wedge compression deformity of L1 is again seen. There is convex bowing of the posterior wall of the L1 vertebral body, similar prior examination. There is mildly accentuated resultant thoracolumbar kyphosis and dextrocurvature without associated listhesis.   Stable grade 1 anterolisthesis L4-5. Remaining vertebral body height is preserved. No acute fracture. Intervertebral disc space narrowing at L4-5 is unchanged. Remaining intervertebral disc heights are preserved. Paraspinal soft tissues are unremarkable.   IMPRESSION: 1. Stable severe anterior wedge compression deformity of L1. 2. Stable grade 1 anterolisthesis L4-5.     Electronically Signed   By: Fidela Salisbury M.D.   On: 07/11/2022 17:22  I have personally reviewed the images and agree with the above interpretation.   Assessment and Plan: Mr. Greve is a pleasant 82 y.o. male with known L1 compression fracture s/p fall on 05/30/22. He has intermittent LBP with no leg pain. No numbness, tingling, or weakness.   Xrays from today showed progression of L1 fracture in comparison to previous imaging.   Treatment options discussed with patient and following plan made:   - Xrays reviewed with patient and his wife. Discussed that fracture had worsened/progressed since his last imaging.  Discussed getting MRI to look into this further and/or to consider kyphoplasty. Pain is tolerable and he and his wife want to hold on further imaging. I agree with this.  - Continue with LSO brace. Reviewed proper use. No bending, twisting, or lifting.  - Continue prn hydrocodone for severe pain. Reviewed dosing and side effects. PMP reviewed and is appropriate.  - Can take OTC tylenol as well. No more than '3000mg'$   tylenol per day. Reminded wife that there is tylenol in hydrocodone as well.  - If he gets worse with increased pain, leg pain, numbness, tingling, or weakness then would recommend MRI. Wife will call.  - Follow up in 4 weeks with repeat lumbar xrays.   I spent a total of 35 minutes in face-to-face and non-face-to-face activities related to this patient's care today including review of outside records, review of imaging, review of symptoms, physical exam, discussion of differential diagnosis, discussion of treatment options, and documentation.   Thank you for involving me in the care of this patient.   Geronimo Boot PA-C Dept. of Neurosurgery

## 2022-07-15 ENCOUNTER — Encounter: Payer: Self-pay | Admitting: Podiatry

## 2022-07-15 ENCOUNTER — Ambulatory Visit: Payer: Medicare HMO | Admitting: Podiatry

## 2022-07-15 VITALS — BP 124/73 | HR 66

## 2022-07-15 DIAGNOSIS — M79674 Pain in right toe(s): Secondary | ICD-10-CM | POA: Diagnosis not present

## 2022-07-15 DIAGNOSIS — M79675 Pain in left toe(s): Secondary | ICD-10-CM

## 2022-07-15 DIAGNOSIS — B351 Tinea unguium: Secondary | ICD-10-CM

## 2022-07-16 ENCOUNTER — Ambulatory Visit: Payer: Medicare HMO | Admitting: Orthopedic Surgery

## 2022-07-16 ENCOUNTER — Encounter: Payer: Self-pay | Admitting: Orthopedic Surgery

## 2022-07-16 VITALS — BP 140/78 | Ht 74.0 in | Wt 172.0 lb

## 2022-07-16 DIAGNOSIS — W19XXXD Unspecified fall, subsequent encounter: Secondary | ICD-10-CM

## 2022-07-16 DIAGNOSIS — S32010A Wedge compression fracture of first lumbar vertebra, initial encounter for closed fracture: Secondary | ICD-10-CM

## 2022-07-16 DIAGNOSIS — S32010D Wedge compression fracture of first lumbar vertebra, subsequent encounter for fracture with routine healing: Secondary | ICD-10-CM | POA: Diagnosis not present

## 2022-07-16 NOTE — Patient Instructions (Signed)
It was so nice to see you today. Thank you so much for coming in.    Your lower back xrays showed a stable compression fracture at L1.   Wear your brace. No bending, twisting, or lifting. Do not wear brace to bed. Can remove if sitting and watching TV.   I will see you back in 4-6 weeks with repeat xrays (get prior to the visit, can do the day before). Please do not hesitate to call if you have any questions or concerns. You can also message me in [place].   Geronimo Boot PA-C [phone]

## 2022-07-16 NOTE — Progress Notes (Signed)
  Subjective:  Patient ID: Jesus Scott, male    DOB: 02-08-1941,  MRN: [id number]  Chief Complaint  Patient presents with   Nail Problem    "My toenail" Wife stated, "Look at his toe." N - toenail  L - hallux rt D - a long time O - gradually worse C - big, thick, hurt in the past A - none T - soaked it in salt water    82 y.o. male presents with the above complaint. History confirmed with patient.   Objective:  Physical Exam: warm, good capillary refill, no trophic changes or ulcerative lesions, normal DP and PT pulses, normal sensory exam, and severely mycotic dystrophic left hallux nail with thickening nail plate.  Assessment:   1. Pain due to onychomycosis of toenails of both feet      Plan:  Patient was evaluated and treated and all questions answered.  Discussed the etiology and treatment options for the condition in detail with the patient. Educated patient on the topical and oral treatment options for mycotic nails. Recommended debridement of the nails today. Sharp and mechanical debridement performed of all painful and mycotic nails today. Nails debrided in length and thickness using a nail nipper to level of comfort.  The hallux nail was thinned with the burr.  Discussed treatment options including appropriate shoe gear. Follow up as needed for painful nails.    Return if symptoms worsen or fail to improve.

## 2022-07-30 DIAGNOSIS — R0781 Pleurodynia: Secondary | ICD-10-CM | POA: Diagnosis not present

## 2022-07-30 DIAGNOSIS — R109 Unspecified abdominal pain: Secondary | ICD-10-CM | POA: Diagnosis not present

## 2022-07-30 DIAGNOSIS — Z0000 Encounter for general adult medical examination without abnormal findings: Secondary | ICD-10-CM | POA: Diagnosis not present

## 2022-07-30 DIAGNOSIS — E538 Deficiency of other specified B group vitamins: Secondary | ICD-10-CM | POA: Diagnosis not present

## 2022-07-30 DIAGNOSIS — E559 Vitamin D deficiency, unspecified: Secondary | ICD-10-CM | POA: Diagnosis not present

## 2022-07-30 DIAGNOSIS — R413 Other amnesia: Secondary | ICD-10-CM | POA: Diagnosis not present

## 2022-07-31 ENCOUNTER — Other Ambulatory Visit: Payer: Self-pay | Admitting: Internal Medicine

## 2022-07-31 DIAGNOSIS — R1012 Left upper quadrant pain: Secondary | ICD-10-CM

## 2022-08-02 ENCOUNTER — Ambulatory Visit
Admission: RE | Admit: 2022-08-02 | Discharge: 2022-08-02 | Disposition: A | Discharge status: Home / Self Care | Payer: Medicare HMO | Source: Ambulatory Visit | Attending: Internal Medicine | Admitting: Internal Medicine

## 2022-08-02 DIAGNOSIS — R109 Unspecified abdominal pain: Secondary | ICD-10-CM | POA: Diagnosis not present

## 2022-08-02 DIAGNOSIS — R1012 Left upper quadrant pain: Secondary | ICD-10-CM | POA: Diagnosis not present

## 2022-08-22 ENCOUNTER — Ambulatory Visit
Admission: RE | Admit: 2022-08-22 | Discharge: 2022-08-22 | Disposition: A | Discharge status: Home / Self Care | Payer: Medicare HMO | Attending: Orthopedic Surgery | Admitting: Orthopedic Surgery

## 2022-08-22 ENCOUNTER — Ambulatory Visit
Admission: RE | Admit: 2022-08-22 | Discharge: 2022-08-22 | Disposition: A | Discharge status: Home / Self Care | Payer: Medicare HMO | Source: Ambulatory Visit | Attending: Orthopedic Surgery | Admitting: Orthopedic Surgery

## 2022-08-22 DIAGNOSIS — S32010A Wedge compression fracture of first lumbar vertebra, initial encounter for closed fracture: Secondary | ICD-10-CM | POA: Diagnosis not present

## 2022-08-27 NOTE — Progress Notes (Unsigned)
Referring Physician:  Marguarite Arbour, MD [address] [facility] [place],  [place] [address]  Primary Physician:  Marguarite Arbour, MD  History of Present Illness: 08/27/2022 Jesus Scott has a history of dementia. His wife helps with history when needed.   Last seen by me on 07/16/22 for L1 compression fracture s/p fall on 05/30/22. He had intermittent LBP with no leg pain.   He was to wear LSO brace. No bending, twisting, or lifting.   He is here for follow up.   He has no back or leg pain. No numbness, tingling, or weakness. His wife states that he seems to be doing well.    Review of Systems:  A 10 point review of systems is negative, except for the pertinent positives and negatives detailed in the HPI.  Past Medical History: Past Medical History:  Diagnosis Date   Arthritis    hands   Memory loss    mild    Past Surgical History: Past Surgical History:  Procedure Laterality Date   CATARACT EXTRACTION W/PHACO Right 06/09/2019   Procedure: CATARACT EXTRACTION PHACO AND INTRAOCULAR LENS PLACEMENT (IOC) RIGHT 5.90  00:55.7  10.7%;  Surgeon: Lockie Mola, MD;  Location: [facility];  Service: Ophthalmology;  Laterality: Right;   COLONOSCOPY     INGUINAL HERNIA REPAIR Left 06/17/2018   Procedure: HERNIA REPAIR INGUINAL ADULT;  Surgeon: Carolan Shiver, MD;  Location: [facility];  Service: General;  Laterality: Left;   JOINT REPLACEMENT Bilateral 2010, 2012   knee   TONSILLECTOMY      Allergies: Allergies as of 08/29/2022   (No Known Allergies)    Medications: Outpatient Encounter Medications as of 08/29/2022  Medication Sig   amitriptyline (ELAVIL) 25 MG tablet Take 25 mg by mouth at bedtime.   Apoaequorin 10 MG CAPS Take 10 mg by mouth daily.    Ascorbic Acid (VITAMIN C PO) Take by mouth daily.   donepezil (ARICEPT) 5 MG tablet Take 5 mg by mouth daily.   HYDROcodone-acetaminophen (NORCO/VICODIN) 5-325 MG tablet  Take 1 tablet by mouth every 8 (eight) hours as needed for severe pain.   Multiple Vitamins-Minerals (AIRBORNE PO) Take by mouth daily.   No facility-administered encounter medications on file as of 08/29/2022.    Social History: Social History   Tobacco Use   Smoking status: Former    Packs/day: 1.00    Years: 20.00    Additional pack years: 0.00    Total pack years: 20.00    Types: Cigarettes    Quit date: 06/16/1998    Years since quitting: 24.2   Smokeless tobacco: Never   Tobacco comments:    may have occasional cig, none for several months  Vaping Use   Vaping Use: Never used  Substance Use Topics   Alcohol use: Yes    Alcohol/week: 1.0 standard drink of alcohol    Types: 1 Standard drinks or equivalent per week    Comment: occ    Drug use: Never    Family Medical History: No family history on file.  Physical Examination: There were no vitals filed for this visit.    He is alert. Speech is clear and fluent. Fund of knowledge is appropriate.   Cranial Nerves: Pupils equal round and reactive to light.  Facial tone is symmetric.    No lumbar tenderness. No tenderness at L1.   No abnormal lesions on exposed skin.   Strength:  Side Iliopsoas Quads Hamstring PF  DF EHL  R 5 5 5 5 5 5   L 5 5 5 5 5 5    Bilateral lower extremity sensation is intact to light touch.     Gait is normal.     Medical Decision Making  Imaging: Xrays of lumbar spine dated 08/22/22:  FINDINGS: Severe L1 compression deformity is unchanged. No new fractures are seen. There is stable 5 mm of anterolisthesis at L4-L5. Alignment is otherwise anatomic. Disc spaces are preserved. There are degenerative changes of facets at L4-L5 and L5-S1. There are atherosclerotic calcifications of the aorta.   IMPRESSION: 1. Unchanged severe L1 compression deformity. No new fractures are seen. 2. Stable grade 1 anterolisthesis at L4-L5.     Electronically Signed   By: Darliss Cheney M.D.   On:  08/24/2022 20:44  I have personally reviewed the images and agree with the above interpretation.   Assessment and Plan: Mr. Brosius is a pleasant 82 y.o. male with known L1 compression fracture s/p fall on 05/30/22. He has no current back or leg pain. No numbness, tingling, or weakness.   Xrays show a stable L1 compression fracture. He also has known lumbar spondylosis with slip at L4-L5.    Treatment options discussed with patient and his wife. Following plan made:   - He can stop wearing brace.  - Return to activity as tolerated.  - Call with any questions or concerns. He will f/u prn.   I spent a total of 15 minutes in face-to-face and non-face-to-face activities related to this patient's care today including review of outside records, review of imaging, review of symptoms, physical exam, discussion of differential diagnosis, discussion of treatment options, and documentation.   Jesus Leach PA-C Dept. of Neurosurgery

## 2022-08-29 ENCOUNTER — Ambulatory Visit: Payer: Medicare HMO | Admitting: Orthopedic Surgery

## 2022-08-29 ENCOUNTER — Encounter: Payer: Self-pay | Admitting: Orthopedic Surgery

## 2022-08-29 VITALS — BP 120/78 | Ht 74.0 in | Wt 172.0 lb

## 2022-08-29 DIAGNOSIS — S32010A Wedge compression fracture of first lumbar vertebra, initial encounter for closed fracture: Secondary | ICD-10-CM

## 2022-08-29 DIAGNOSIS — S32010D Wedge compression fracture of first lumbar vertebra, subsequent encounter for fracture with routine healing: Secondary | ICD-10-CM | POA: Diagnosis not present

## 2022-08-29 DIAGNOSIS — W19XXXD Unspecified fall, subsequent encounter: Secondary | ICD-10-CM | POA: Diagnosis not present

## 2022-08-29 DIAGNOSIS — M47816 Spondylosis without myelopathy or radiculopathy, lumbar region: Secondary | ICD-10-CM | POA: Diagnosis not present

## 2022-10-22 DIAGNOSIS — K8020 Calculus of gallbladder without cholecystitis without obstruction: Secondary | ICD-10-CM | POA: Diagnosis not present

## 2022-10-30 DIAGNOSIS — E538 Deficiency of other specified B group vitamins: Secondary | ICD-10-CM | POA: Diagnosis not present

## 2022-10-30 DIAGNOSIS — E559 Vitamin D deficiency, unspecified: Secondary | ICD-10-CM | POA: Diagnosis not present

## 2022-10-30 DIAGNOSIS — Z79899 Other long term (current) drug therapy: Secondary | ICD-10-CM | POA: Diagnosis not present

## 2022-10-30 DIAGNOSIS — F0390 Unspecified dementia without behavioral disturbance: Secondary | ICD-10-CM | POA: Diagnosis not present

## 2022-11-05 DIAGNOSIS — G309 Alzheimer's disease, unspecified: Secondary | ICD-10-CM | POA: Diagnosis not present

## 2022-11-05 DIAGNOSIS — F0280 Dementia in other diseases classified elsewhere without behavioral disturbance: Secondary | ICD-10-CM | POA: Diagnosis not present

## 2022-11-05 DIAGNOSIS — Z7189 Other specified counseling: Secondary | ICD-10-CM | POA: Diagnosis not present

## 2022-11-05 DIAGNOSIS — F0150 Vascular dementia without behavioral disturbance: Secondary | ICD-10-CM | POA: Diagnosis not present

## 2022-11-12 DIAGNOSIS — F01B3 Vascular dementia, moderate, with mood disturbance: Secondary | ICD-10-CM | POA: Diagnosis not present

## 2022-11-12 DIAGNOSIS — I10 Essential (primary) hypertension: Secondary | ICD-10-CM | POA: Diagnosis not present

## 2022-11-12 DIAGNOSIS — G479 Sleep disorder, unspecified: Secondary | ICD-10-CM | POA: Diagnosis not present

## 2022-11-12 DIAGNOSIS — F32A Depression, unspecified: Secondary | ICD-10-CM | POA: Diagnosis not present

## 2022-11-12 DIAGNOSIS — M170 Bilateral primary osteoarthritis of knee: Secondary | ICD-10-CM | POA: Diagnosis not present

## 2022-11-12 DIAGNOSIS — S32018D Other fracture of first lumbar vertebra, subsequent encounter for fracture with routine healing: Secondary | ICD-10-CM | POA: Diagnosis not present

## 2022-11-12 DIAGNOSIS — F02B3 Dementia in other diseases classified elsewhere, moderate, with mood disturbance: Secondary | ICD-10-CM | POA: Diagnosis not present

## 2022-11-12 DIAGNOSIS — G4700 Insomnia, unspecified: Secondary | ICD-10-CM | POA: Diagnosis not present

## 2022-11-12 DIAGNOSIS — G301 Alzheimer's disease with late onset: Secondary | ICD-10-CM | POA: Diagnosis not present

## 2022-11-13 DIAGNOSIS — G301 Alzheimer's disease with late onset: Secondary | ICD-10-CM | POA: Diagnosis not present

## 2022-11-13 DIAGNOSIS — S32018D Other fracture of first lumbar vertebra, subsequent encounter for fracture with routine healing: Secondary | ICD-10-CM | POA: Diagnosis not present

## 2022-11-13 DIAGNOSIS — F01B3 Vascular dementia, moderate, with mood disturbance: Secondary | ICD-10-CM | POA: Diagnosis not present

## 2022-11-13 DIAGNOSIS — G479 Sleep disorder, unspecified: Secondary | ICD-10-CM | POA: Diagnosis not present

## 2022-11-13 DIAGNOSIS — F02B3 Dementia in other diseases classified elsewhere, moderate, with mood disturbance: Secondary | ICD-10-CM | POA: Diagnosis not present

## 2022-11-13 DIAGNOSIS — F32A Depression, unspecified: Secondary | ICD-10-CM | POA: Diagnosis not present

## 2022-11-13 DIAGNOSIS — I10 Essential (primary) hypertension: Secondary | ICD-10-CM | POA: Diagnosis not present

## 2022-11-13 DIAGNOSIS — G4700 Insomnia, unspecified: Secondary | ICD-10-CM | POA: Diagnosis not present

## 2022-11-13 DIAGNOSIS — M170 Bilateral primary osteoarthritis of knee: Secondary | ICD-10-CM | POA: Diagnosis not present

## 2022-11-18 DIAGNOSIS — F02B3 Dementia in other diseases classified elsewhere, moderate, with mood disturbance: Secondary | ICD-10-CM | POA: Diagnosis not present

## 2022-11-18 DIAGNOSIS — F01B3 Vascular dementia, moderate, with mood disturbance: Secondary | ICD-10-CM | POA: Diagnosis not present

## 2022-11-18 DIAGNOSIS — G479 Sleep disorder, unspecified: Secondary | ICD-10-CM | POA: Diagnosis not present

## 2022-11-18 DIAGNOSIS — G301 Alzheimer's disease with late onset: Secondary | ICD-10-CM | POA: Diagnosis not present

## 2022-11-18 DIAGNOSIS — F32A Depression, unspecified: Secondary | ICD-10-CM | POA: Diagnosis not present

## 2022-11-18 DIAGNOSIS — G4700 Insomnia, unspecified: Secondary | ICD-10-CM | POA: Diagnosis not present

## 2022-11-18 DIAGNOSIS — S32018D Other fracture of first lumbar vertebra, subsequent encounter for fracture with routine healing: Secondary | ICD-10-CM | POA: Diagnosis not present

## 2022-11-18 DIAGNOSIS — M170 Bilateral primary osteoarthritis of knee: Secondary | ICD-10-CM | POA: Diagnosis not present

## 2022-11-18 DIAGNOSIS — I10 Essential (primary) hypertension: Secondary | ICD-10-CM | POA: Diagnosis not present

## 2022-11-20 DIAGNOSIS — F01B3 Vascular dementia, moderate, with mood disturbance: Secondary | ICD-10-CM | POA: Diagnosis not present

## 2022-11-20 DIAGNOSIS — F32A Depression, unspecified: Secondary | ICD-10-CM | POA: Diagnosis not present

## 2022-11-20 DIAGNOSIS — S32018D Other fracture of first lumbar vertebra, subsequent encounter for fracture with routine healing: Secondary | ICD-10-CM | POA: Diagnosis not present

## 2022-11-20 DIAGNOSIS — G479 Sleep disorder, unspecified: Secondary | ICD-10-CM | POA: Diagnosis not present

## 2022-11-20 DIAGNOSIS — F02B3 Dementia in other diseases classified elsewhere, moderate, with mood disturbance: Secondary | ICD-10-CM | POA: Diagnosis not present

## 2022-11-20 DIAGNOSIS — M170 Bilateral primary osteoarthritis of knee: Secondary | ICD-10-CM | POA: Diagnosis not present

## 2022-11-20 DIAGNOSIS — G301 Alzheimer's disease with late onset: Secondary | ICD-10-CM | POA: Diagnosis not present

## 2022-11-20 DIAGNOSIS — G4700 Insomnia, unspecified: Secondary | ICD-10-CM | POA: Diagnosis not present

## 2022-11-20 DIAGNOSIS — I10 Essential (primary) hypertension: Secondary | ICD-10-CM | POA: Diagnosis not present

## 2022-11-25 DIAGNOSIS — M170 Bilateral primary osteoarthritis of knee: Secondary | ICD-10-CM | POA: Diagnosis not present

## 2022-11-25 DIAGNOSIS — G479 Sleep disorder, unspecified: Secondary | ICD-10-CM | POA: Diagnosis not present

## 2022-11-25 DIAGNOSIS — F02B3 Dementia in other diseases classified elsewhere, moderate, with mood disturbance: Secondary | ICD-10-CM | POA: Diagnosis not present

## 2022-11-25 DIAGNOSIS — S32018D Other fracture of first lumbar vertebra, subsequent encounter for fracture with routine healing: Secondary | ICD-10-CM | POA: Diagnosis not present

## 2022-11-25 DIAGNOSIS — F32A Depression, unspecified: Secondary | ICD-10-CM | POA: Diagnosis not present

## 2022-11-25 DIAGNOSIS — I10 Essential (primary) hypertension: Secondary | ICD-10-CM | POA: Diagnosis not present

## 2022-11-25 DIAGNOSIS — G4700 Insomnia, unspecified: Secondary | ICD-10-CM | POA: Diagnosis not present

## 2022-11-25 DIAGNOSIS — F01B3 Vascular dementia, moderate, with mood disturbance: Secondary | ICD-10-CM | POA: Diagnosis not present

## 2022-11-25 DIAGNOSIS — G301 Alzheimer's disease with late onset: Secondary | ICD-10-CM | POA: Diagnosis not present

## 2022-11-27 DIAGNOSIS — F32A Depression, unspecified: Secondary | ICD-10-CM | POA: Diagnosis not present

## 2022-11-27 DIAGNOSIS — S32018D Other fracture of first lumbar vertebra, subsequent encounter for fracture with routine healing: Secondary | ICD-10-CM | POA: Diagnosis not present

## 2022-11-27 DIAGNOSIS — F02B3 Dementia in other diseases classified elsewhere, moderate, with mood disturbance: Secondary | ICD-10-CM | POA: Diagnosis not present

## 2022-11-27 DIAGNOSIS — G479 Sleep disorder, unspecified: Secondary | ICD-10-CM | POA: Diagnosis not present

## 2022-11-27 DIAGNOSIS — G301 Alzheimer's disease with late onset: Secondary | ICD-10-CM | POA: Diagnosis not present

## 2022-11-27 DIAGNOSIS — F01B3 Vascular dementia, moderate, with mood disturbance: Secondary | ICD-10-CM | POA: Diagnosis not present

## 2022-11-27 DIAGNOSIS — M170 Bilateral primary osteoarthritis of knee: Secondary | ICD-10-CM | POA: Diagnosis not present

## 2022-11-27 DIAGNOSIS — I10 Essential (primary) hypertension: Secondary | ICD-10-CM | POA: Diagnosis not present

## 2022-11-27 DIAGNOSIS — G4700 Insomnia, unspecified: Secondary | ICD-10-CM | POA: Diagnosis not present

## 2022-11-28 DIAGNOSIS — I10 Essential (primary) hypertension: Secondary | ICD-10-CM | POA: Diagnosis not present

## 2022-11-28 DIAGNOSIS — G301 Alzheimer's disease with late onset: Secondary | ICD-10-CM | POA: Diagnosis not present

## 2022-11-28 DIAGNOSIS — F32A Depression, unspecified: Secondary | ICD-10-CM | POA: Diagnosis not present

## 2022-11-28 DIAGNOSIS — F02B3 Dementia in other diseases classified elsewhere, moderate, with mood disturbance: Secondary | ICD-10-CM | POA: Diagnosis not present

## 2022-11-28 DIAGNOSIS — G479 Sleep disorder, unspecified: Secondary | ICD-10-CM | POA: Diagnosis not present

## 2022-11-28 DIAGNOSIS — S32018D Other fracture of first lumbar vertebra, subsequent encounter for fracture with routine healing: Secondary | ICD-10-CM | POA: Diagnosis not present

## 2022-11-28 DIAGNOSIS — G4700 Insomnia, unspecified: Secondary | ICD-10-CM | POA: Diagnosis not present

## 2022-11-28 DIAGNOSIS — M170 Bilateral primary osteoarthritis of knee: Secondary | ICD-10-CM | POA: Diagnosis not present

## 2022-11-28 DIAGNOSIS — F01B3 Vascular dementia, moderate, with mood disturbance: Secondary | ICD-10-CM | POA: Diagnosis not present

## 2022-12-02 DIAGNOSIS — G479 Sleep disorder, unspecified: Secondary | ICD-10-CM | POA: Diagnosis not present

## 2022-12-02 DIAGNOSIS — M170 Bilateral primary osteoarthritis of knee: Secondary | ICD-10-CM | POA: Diagnosis not present

## 2022-12-02 DIAGNOSIS — S32018D Other fracture of first lumbar vertebra, subsequent encounter for fracture with routine healing: Secondary | ICD-10-CM | POA: Diagnosis not present

## 2022-12-02 DIAGNOSIS — F01B3 Vascular dementia, moderate, with mood disturbance: Secondary | ICD-10-CM | POA: Diagnosis not present

## 2022-12-02 DIAGNOSIS — G301 Alzheimer's disease with late onset: Secondary | ICD-10-CM | POA: Diagnosis not present

## 2022-12-02 DIAGNOSIS — G4700 Insomnia, unspecified: Secondary | ICD-10-CM | POA: Diagnosis not present

## 2022-12-02 DIAGNOSIS — F02B3 Dementia in other diseases classified elsewhere, moderate, with mood disturbance: Secondary | ICD-10-CM | POA: Diagnosis not present

## 2022-12-02 DIAGNOSIS — I10 Essential (primary) hypertension: Secondary | ICD-10-CM | POA: Diagnosis not present

## 2022-12-02 DIAGNOSIS — F32A Depression, unspecified: Secondary | ICD-10-CM | POA: Diagnosis not present

## 2022-12-09 DIAGNOSIS — M170 Bilateral primary osteoarthritis of knee: Secondary | ICD-10-CM | POA: Diagnosis not present

## 2022-12-09 DIAGNOSIS — I10 Essential (primary) hypertension: Secondary | ICD-10-CM | POA: Diagnosis not present

## 2022-12-09 DIAGNOSIS — F02B3 Dementia in other diseases classified elsewhere, moderate, with mood disturbance: Secondary | ICD-10-CM | POA: Diagnosis not present

## 2022-12-09 DIAGNOSIS — G4700 Insomnia, unspecified: Secondary | ICD-10-CM | POA: Diagnosis not present

## 2022-12-09 DIAGNOSIS — G479 Sleep disorder, unspecified: Secondary | ICD-10-CM | POA: Diagnosis not present

## 2022-12-09 DIAGNOSIS — F01B3 Vascular dementia, moderate, with mood disturbance: Secondary | ICD-10-CM | POA: Diagnosis not present

## 2022-12-09 DIAGNOSIS — S32018D Other fracture of first lumbar vertebra, subsequent encounter for fracture with routine healing: Secondary | ICD-10-CM | POA: Diagnosis not present

## 2022-12-09 DIAGNOSIS — G301 Alzheimer's disease with late onset: Secondary | ICD-10-CM | POA: Diagnosis not present

## 2022-12-09 DIAGNOSIS — F32A Depression, unspecified: Secondary | ICD-10-CM | POA: Diagnosis not present

## 2022-12-12 DIAGNOSIS — G4700 Insomnia, unspecified: Secondary | ICD-10-CM | POA: Diagnosis not present

## 2022-12-12 DIAGNOSIS — I10 Essential (primary) hypertension: Secondary | ICD-10-CM | POA: Diagnosis not present

## 2022-12-12 DIAGNOSIS — F32A Depression, unspecified: Secondary | ICD-10-CM | POA: Diagnosis not present

## 2022-12-12 DIAGNOSIS — S32018D Other fracture of first lumbar vertebra, subsequent encounter for fracture with routine healing: Secondary | ICD-10-CM | POA: Diagnosis not present

## 2022-12-12 DIAGNOSIS — M170 Bilateral primary osteoarthritis of knee: Secondary | ICD-10-CM | POA: Diagnosis not present

## 2022-12-12 DIAGNOSIS — F01B3 Vascular dementia, moderate, with mood disturbance: Secondary | ICD-10-CM | POA: Diagnosis not present

## 2022-12-12 DIAGNOSIS — G479 Sleep disorder, unspecified: Secondary | ICD-10-CM | POA: Diagnosis not present

## 2022-12-12 DIAGNOSIS — G301 Alzheimer's disease with late onset: Secondary | ICD-10-CM | POA: Diagnosis not present

## 2022-12-12 DIAGNOSIS — F02B3 Dementia in other diseases classified elsewhere, moderate, with mood disturbance: Secondary | ICD-10-CM | POA: Diagnosis not present

## 2022-12-23 DIAGNOSIS — S32018D Other fracture of first lumbar vertebra, subsequent encounter for fracture with routine healing: Secondary | ICD-10-CM | POA: Diagnosis not present

## 2022-12-23 DIAGNOSIS — G479 Sleep disorder, unspecified: Secondary | ICD-10-CM | POA: Diagnosis not present

## 2022-12-23 DIAGNOSIS — G4700 Insomnia, unspecified: Secondary | ICD-10-CM | POA: Diagnosis not present

## 2022-12-23 DIAGNOSIS — F32A Depression, unspecified: Secondary | ICD-10-CM | POA: Diagnosis not present

## 2022-12-23 DIAGNOSIS — M170 Bilateral primary osteoarthritis of knee: Secondary | ICD-10-CM | POA: Diagnosis not present

## 2022-12-23 DIAGNOSIS — G301 Alzheimer's disease with late onset: Secondary | ICD-10-CM | POA: Diagnosis not present

## 2022-12-23 DIAGNOSIS — F01B3 Vascular dementia, moderate, with mood disturbance: Secondary | ICD-10-CM | POA: Diagnosis not present

## 2022-12-23 DIAGNOSIS — I10 Essential (primary) hypertension: Secondary | ICD-10-CM | POA: Diagnosis not present

## 2022-12-23 DIAGNOSIS — F02B3 Dementia in other diseases classified elsewhere, moderate, with mood disturbance: Secondary | ICD-10-CM | POA: Diagnosis not present

## 2022-12-28 DIAGNOSIS — G4700 Insomnia, unspecified: Secondary | ICD-10-CM | POA: Diagnosis not present

## 2022-12-28 DIAGNOSIS — F02B3 Dementia in other diseases classified elsewhere, moderate, with mood disturbance: Secondary | ICD-10-CM | POA: Diagnosis not present

## 2022-12-28 DIAGNOSIS — G301 Alzheimer's disease with late onset: Secondary | ICD-10-CM | POA: Diagnosis not present

## 2022-12-28 DIAGNOSIS — S32018D Other fracture of first lumbar vertebra, subsequent encounter for fracture with routine healing: Secondary | ICD-10-CM | POA: Diagnosis not present

## 2022-12-28 DIAGNOSIS — M170 Bilateral primary osteoarthritis of knee: Secondary | ICD-10-CM | POA: Diagnosis not present

## 2022-12-28 DIAGNOSIS — F01B3 Vascular dementia, moderate, with mood disturbance: Secondary | ICD-10-CM | POA: Diagnosis not present

## 2022-12-28 DIAGNOSIS — G479 Sleep disorder, unspecified: Secondary | ICD-10-CM | POA: Diagnosis not present

## 2022-12-28 DIAGNOSIS — F32A Depression, unspecified: Secondary | ICD-10-CM | POA: Diagnosis not present

## 2022-12-28 DIAGNOSIS — I10 Essential (primary) hypertension: Secondary | ICD-10-CM | POA: Diagnosis not present

## 2023-01-01 DIAGNOSIS — S32018D Other fracture of first lumbar vertebra, subsequent encounter for fracture with routine healing: Secondary | ICD-10-CM | POA: Diagnosis not present

## 2023-01-01 DIAGNOSIS — G479 Sleep disorder, unspecified: Secondary | ICD-10-CM | POA: Diagnosis not present

## 2023-01-01 DIAGNOSIS — I10 Essential (primary) hypertension: Secondary | ICD-10-CM | POA: Diagnosis not present

## 2023-01-01 DIAGNOSIS — G301 Alzheimer's disease with late onset: Secondary | ICD-10-CM | POA: Diagnosis not present

## 2023-01-01 DIAGNOSIS — F02B3 Dementia in other diseases classified elsewhere, moderate, with mood disturbance: Secondary | ICD-10-CM | POA: Diagnosis not present

## 2023-01-01 DIAGNOSIS — G4700 Insomnia, unspecified: Secondary | ICD-10-CM | POA: Diagnosis not present

## 2023-01-01 DIAGNOSIS — F01B3 Vascular dementia, moderate, with mood disturbance: Secondary | ICD-10-CM | POA: Diagnosis not present

## 2023-01-01 DIAGNOSIS — F32A Depression, unspecified: Secondary | ICD-10-CM | POA: Diagnosis not present

## 2023-01-01 DIAGNOSIS — M170 Bilateral primary osteoarthritis of knee: Secondary | ICD-10-CM | POA: Diagnosis not present

## 2023-01-06 DIAGNOSIS — G4700 Insomnia, unspecified: Secondary | ICD-10-CM | POA: Diagnosis not present

## 2023-01-06 DIAGNOSIS — I10 Essential (primary) hypertension: Secondary | ICD-10-CM | POA: Diagnosis not present

## 2023-01-06 DIAGNOSIS — M170 Bilateral primary osteoarthritis of knee: Secondary | ICD-10-CM | POA: Diagnosis not present

## 2023-01-06 DIAGNOSIS — S32018D Other fracture of first lumbar vertebra, subsequent encounter for fracture with routine healing: Secondary | ICD-10-CM | POA: Diagnosis not present

## 2023-01-06 DIAGNOSIS — F02B3 Dementia in other diseases classified elsewhere, moderate, with mood disturbance: Secondary | ICD-10-CM | POA: Diagnosis not present

## 2023-01-06 DIAGNOSIS — G479 Sleep disorder, unspecified: Secondary | ICD-10-CM | POA: Diagnosis not present

## 2023-01-06 DIAGNOSIS — F01B3 Vascular dementia, moderate, with mood disturbance: Secondary | ICD-10-CM | POA: Diagnosis not present

## 2023-01-06 DIAGNOSIS — G301 Alzheimer's disease with late onset: Secondary | ICD-10-CM | POA: Diagnosis not present

## 2023-01-06 DIAGNOSIS — F32A Depression, unspecified: Secondary | ICD-10-CM | POA: Diagnosis not present

## 2023-01-08 DIAGNOSIS — S32018D Other fracture of first lumbar vertebra, subsequent encounter for fracture with routine healing: Secondary | ICD-10-CM | POA: Diagnosis not present

## 2023-01-08 DIAGNOSIS — G4700 Insomnia, unspecified: Secondary | ICD-10-CM | POA: Diagnosis not present

## 2023-01-08 DIAGNOSIS — G301 Alzheimer's disease with late onset: Secondary | ICD-10-CM | POA: Diagnosis not present

## 2023-01-08 DIAGNOSIS — F32A Depression, unspecified: Secondary | ICD-10-CM | POA: Diagnosis not present

## 2023-01-08 DIAGNOSIS — I10 Essential (primary) hypertension: Secondary | ICD-10-CM | POA: Diagnosis not present

## 2023-01-08 DIAGNOSIS — F02B3 Dementia in other diseases classified elsewhere, moderate, with mood disturbance: Secondary | ICD-10-CM | POA: Diagnosis not present

## 2023-01-08 DIAGNOSIS — M170 Bilateral primary osteoarthritis of knee: Secondary | ICD-10-CM | POA: Diagnosis not present

## 2023-01-08 DIAGNOSIS — F01B3 Vascular dementia, moderate, with mood disturbance: Secondary | ICD-10-CM | POA: Diagnosis not present

## 2023-01-08 DIAGNOSIS — G479 Sleep disorder, unspecified: Secondary | ICD-10-CM | POA: Diagnosis not present

## 2023-01-11 DIAGNOSIS — S32018D Other fracture of first lumbar vertebra, subsequent encounter for fracture with routine healing: Secondary | ICD-10-CM | POA: Diagnosis not present

## 2023-01-11 DIAGNOSIS — G4700 Insomnia, unspecified: Secondary | ICD-10-CM | POA: Diagnosis not present

## 2023-01-11 DIAGNOSIS — F32A Depression, unspecified: Secondary | ICD-10-CM | POA: Diagnosis not present

## 2023-01-11 DIAGNOSIS — F02B3 Dementia in other diseases classified elsewhere, moderate, with mood disturbance: Secondary | ICD-10-CM | POA: Diagnosis not present

## 2023-01-11 DIAGNOSIS — F01B3 Vascular dementia, moderate, with mood disturbance: Secondary | ICD-10-CM | POA: Diagnosis not present

## 2023-01-11 DIAGNOSIS — G301 Alzheimer's disease with late onset: Secondary | ICD-10-CM | POA: Diagnosis not present

## 2023-01-11 DIAGNOSIS — M170 Bilateral primary osteoarthritis of knee: Secondary | ICD-10-CM | POA: Diagnosis not present

## 2023-01-11 DIAGNOSIS — G479 Sleep disorder, unspecified: Secondary | ICD-10-CM | POA: Diagnosis not present

## 2023-01-11 DIAGNOSIS — I10 Essential (primary) hypertension: Secondary | ICD-10-CM | POA: Diagnosis not present

## 2023-01-21 DIAGNOSIS — Z79899 Other long term (current) drug therapy: Secondary | ICD-10-CM | POA: Diagnosis not present

## 2023-01-21 DIAGNOSIS — E538 Deficiency of other specified B group vitamins: Secondary | ICD-10-CM | POA: Diagnosis not present

## 2023-01-21 DIAGNOSIS — R413 Other amnesia: Secondary | ICD-10-CM | POA: Diagnosis not present

## 2023-01-21 DIAGNOSIS — D649 Anemia, unspecified: Secondary | ICD-10-CM | POA: Diagnosis not present

## 2023-01-21 DIAGNOSIS — Z0000 Encounter for general adult medical examination without abnormal findings: Secondary | ICD-10-CM | POA: Diagnosis not present

## 2023-01-21 DIAGNOSIS — E559 Vitamin D deficiency, unspecified: Secondary | ICD-10-CM | POA: Diagnosis not present

## 2023-01-23 DIAGNOSIS — F02B3 Dementia in other diseases classified elsewhere, moderate, with mood disturbance: Secondary | ICD-10-CM | POA: Diagnosis not present

## 2023-01-23 DIAGNOSIS — G479 Sleep disorder, unspecified: Secondary | ICD-10-CM | POA: Diagnosis not present

## 2023-01-23 DIAGNOSIS — S32018D Other fracture of first lumbar vertebra, subsequent encounter for fracture with routine healing: Secondary | ICD-10-CM | POA: Diagnosis not present

## 2023-01-23 DIAGNOSIS — G301 Alzheimer's disease with late onset: Secondary | ICD-10-CM | POA: Diagnosis not present

## 2023-01-23 DIAGNOSIS — M170 Bilateral primary osteoarthritis of knee: Secondary | ICD-10-CM | POA: Diagnosis not present

## 2023-01-23 DIAGNOSIS — F32A Depression, unspecified: Secondary | ICD-10-CM | POA: Diagnosis not present

## 2023-01-23 DIAGNOSIS — G4700 Insomnia, unspecified: Secondary | ICD-10-CM | POA: Diagnosis not present

## 2023-01-23 DIAGNOSIS — F01B3 Vascular dementia, moderate, with mood disturbance: Secondary | ICD-10-CM | POA: Diagnosis not present

## 2023-01-23 DIAGNOSIS — I10 Essential (primary) hypertension: Secondary | ICD-10-CM | POA: Diagnosis not present

## 2023-01-30 DIAGNOSIS — I10 Essential (primary) hypertension: Secondary | ICD-10-CM | POA: Diagnosis not present

## 2023-01-30 DIAGNOSIS — G4700 Insomnia, unspecified: Secondary | ICD-10-CM | POA: Diagnosis not present

## 2023-01-30 DIAGNOSIS — F02B3 Dementia in other diseases classified elsewhere, moderate, with mood disturbance: Secondary | ICD-10-CM | POA: Diagnosis not present

## 2023-01-30 DIAGNOSIS — G301 Alzheimer's disease with late onset: Secondary | ICD-10-CM | POA: Diagnosis not present

## 2023-01-30 DIAGNOSIS — F01B3 Vascular dementia, moderate, with mood disturbance: Secondary | ICD-10-CM | POA: Diagnosis not present

## 2023-01-30 DIAGNOSIS — G479 Sleep disorder, unspecified: Secondary | ICD-10-CM | POA: Diagnosis not present

## 2023-01-30 DIAGNOSIS — F32A Depression, unspecified: Secondary | ICD-10-CM | POA: Diagnosis not present

## 2023-02-03 DIAGNOSIS — G301 Alzheimer's disease with late onset: Secondary | ICD-10-CM | POA: Diagnosis not present

## 2023-02-03 DIAGNOSIS — F32A Depression, unspecified: Secondary | ICD-10-CM | POA: Diagnosis not present

## 2023-02-03 DIAGNOSIS — I10 Essential (primary) hypertension: Secondary | ICD-10-CM | POA: Diagnosis not present

## 2023-02-03 DIAGNOSIS — F01B3 Vascular dementia, moderate, with mood disturbance: Secondary | ICD-10-CM | POA: Diagnosis not present

## 2023-02-03 DIAGNOSIS — S32018D Other fracture of first lumbar vertebra, subsequent encounter for fracture with routine healing: Secondary | ICD-10-CM | POA: Diagnosis not present

## 2023-02-03 DIAGNOSIS — F02B3 Dementia in other diseases classified elsewhere, moderate, with mood disturbance: Secondary | ICD-10-CM | POA: Diagnosis not present

## 2023-02-03 DIAGNOSIS — M170 Bilateral primary osteoarthritis of knee: Secondary | ICD-10-CM | POA: Diagnosis not present

## 2023-02-03 DIAGNOSIS — G4700 Insomnia, unspecified: Secondary | ICD-10-CM | POA: Diagnosis not present

## 2023-02-03 DIAGNOSIS — G479 Sleep disorder, unspecified: Secondary | ICD-10-CM | POA: Diagnosis not present

## 2023-02-10 DIAGNOSIS — S32018D Other fracture of first lumbar vertebra, subsequent encounter for fracture with routine healing: Secondary | ICD-10-CM | POA: Diagnosis not present

## 2023-02-10 DIAGNOSIS — F32A Depression, unspecified: Secondary | ICD-10-CM | POA: Diagnosis not present

## 2023-02-10 DIAGNOSIS — G479 Sleep disorder, unspecified: Secondary | ICD-10-CM | POA: Diagnosis not present

## 2023-02-10 DIAGNOSIS — F02B3 Dementia in other diseases classified elsewhere, moderate, with mood disturbance: Secondary | ICD-10-CM | POA: Diagnosis not present

## 2023-02-10 DIAGNOSIS — G4700 Insomnia, unspecified: Secondary | ICD-10-CM | POA: Diagnosis not present

## 2023-02-10 DIAGNOSIS — F01B3 Vascular dementia, moderate, with mood disturbance: Secondary | ICD-10-CM | POA: Diagnosis not present

## 2023-02-10 DIAGNOSIS — M170 Bilateral primary osteoarthritis of knee: Secondary | ICD-10-CM | POA: Diagnosis not present

## 2023-02-10 DIAGNOSIS — G301 Alzheimer's disease with late onset: Secondary | ICD-10-CM | POA: Diagnosis not present

## 2023-02-10 DIAGNOSIS — I10 Essential (primary) hypertension: Secondary | ICD-10-CM | POA: Diagnosis not present

## 2023-02-18 DIAGNOSIS — S32018D Other fracture of first lumbar vertebra, subsequent encounter for fracture with routine healing: Secondary | ICD-10-CM | POA: Diagnosis not present

## 2023-02-18 DIAGNOSIS — F02B3 Dementia in other diseases classified elsewhere, moderate, with mood disturbance: Secondary | ICD-10-CM | POA: Diagnosis not present

## 2023-02-18 DIAGNOSIS — G479 Sleep disorder, unspecified: Secondary | ICD-10-CM | POA: Diagnosis not present

## 2023-02-18 DIAGNOSIS — F01B3 Vascular dementia, moderate, with mood disturbance: Secondary | ICD-10-CM | POA: Diagnosis not present

## 2023-02-18 DIAGNOSIS — I10 Essential (primary) hypertension: Secondary | ICD-10-CM | POA: Diagnosis not present

## 2023-02-18 DIAGNOSIS — G4700 Insomnia, unspecified: Secondary | ICD-10-CM | POA: Diagnosis not present

## 2023-02-18 DIAGNOSIS — M170 Bilateral primary osteoarthritis of knee: Secondary | ICD-10-CM | POA: Diagnosis not present

## 2023-02-18 DIAGNOSIS — G301 Alzheimer's disease with late onset: Secondary | ICD-10-CM | POA: Diagnosis not present

## 2023-02-18 DIAGNOSIS — F32A Depression, unspecified: Secondary | ICD-10-CM | POA: Diagnosis not present

## 2023-02-19 DIAGNOSIS — H2512 Age-related nuclear cataract, left eye: Secondary | ICD-10-CM | POA: Diagnosis not present

## 2023-02-19 DIAGNOSIS — Z961 Presence of intraocular lens: Secondary | ICD-10-CM | POA: Diagnosis not present

## 2023-03-05 DIAGNOSIS — F0150 Vascular dementia without behavioral disturbance: Secondary | ICD-10-CM | POA: Diagnosis not present

## 2023-03-05 DIAGNOSIS — F0280 Dementia in other diseases classified elsewhere without behavioral disturbance: Secondary | ICD-10-CM | POA: Diagnosis not present

## 2023-03-05 DIAGNOSIS — G309 Alzheimer's disease, unspecified: Secondary | ICD-10-CM | POA: Diagnosis not present

## 2023-03-06 DIAGNOSIS — S32018D Other fracture of first lumbar vertebra, subsequent encounter for fracture with routine healing: Secondary | ICD-10-CM | POA: Diagnosis not present

## 2023-03-06 DIAGNOSIS — G479 Sleep disorder, unspecified: Secondary | ICD-10-CM | POA: Diagnosis not present

## 2023-03-06 DIAGNOSIS — G4700 Insomnia, unspecified: Secondary | ICD-10-CM | POA: Diagnosis not present

## 2023-03-06 DIAGNOSIS — F02B3 Dementia in other diseases classified elsewhere, moderate, with mood disturbance: Secondary | ICD-10-CM | POA: Diagnosis not present

## 2023-03-06 DIAGNOSIS — I10 Essential (primary) hypertension: Secondary | ICD-10-CM | POA: Diagnosis not present

## 2023-03-06 DIAGNOSIS — M170 Bilateral primary osteoarthritis of knee: Secondary | ICD-10-CM | POA: Diagnosis not present

## 2023-03-06 DIAGNOSIS — G301 Alzheimer's disease with late onset: Secondary | ICD-10-CM | POA: Diagnosis not present

## 2023-03-06 DIAGNOSIS — F32A Depression, unspecified: Secondary | ICD-10-CM | POA: Diagnosis not present

## 2023-03-06 DIAGNOSIS — F01B3 Vascular dementia, moderate, with mood disturbance: Secondary | ICD-10-CM | POA: Diagnosis not present

## 2023-04-24 DIAGNOSIS — Z125 Encounter for screening for malignant neoplasm of prostate: Secondary | ICD-10-CM | POA: Diagnosis not present

## 2023-04-24 DIAGNOSIS — E559 Vitamin D deficiency, unspecified: Secondary | ICD-10-CM | POA: Diagnosis not present

## 2023-04-24 DIAGNOSIS — Z79899 Other long term (current) drug therapy: Secondary | ICD-10-CM | POA: Diagnosis not present

## 2023-04-24 DIAGNOSIS — E538 Deficiency of other specified B group vitamins: Secondary | ICD-10-CM | POA: Diagnosis not present

## 2023-04-24 DIAGNOSIS — F0390 Unspecified dementia without behavioral disturbance: Secondary | ICD-10-CM | POA: Diagnosis not present

## 2023-07-22 DIAGNOSIS — Z0000 Encounter for general adult medical examination without abnormal findings: Secondary | ICD-10-CM | POA: Diagnosis not present

## 2023-07-22 DIAGNOSIS — E559 Vitamin D deficiency, unspecified: Secondary | ICD-10-CM | POA: Diagnosis not present

## 2023-07-22 DIAGNOSIS — E538 Deficiency of other specified B group vitamins: Secondary | ICD-10-CM | POA: Diagnosis not present

## 2023-07-22 DIAGNOSIS — F0390 Unspecified dementia without behavioral disturbance: Secondary | ICD-10-CM | POA: Diagnosis not present

## 2023-09-03 DIAGNOSIS — F0150 Vascular dementia without behavioral disturbance: Secondary | ICD-10-CM | POA: Diagnosis not present

## 2023-09-03 DIAGNOSIS — F0280 Dementia in other diseases classified elsewhere without behavioral disturbance: Secondary | ICD-10-CM | POA: Diagnosis not present

## 2023-09-03 DIAGNOSIS — R001 Bradycardia, unspecified: Secondary | ICD-10-CM | POA: Diagnosis not present

## 2023-09-03 DIAGNOSIS — G301 Alzheimer's disease with late onset: Secondary | ICD-10-CM | POA: Diagnosis not present

## 2023-09-03 DIAGNOSIS — Z79899 Other long term (current) drug therapy: Secondary | ICD-10-CM | POA: Diagnosis not present

## 2023-09-03 DIAGNOSIS — F01B18 Vascular dementia, moderate, with other behavioral disturbance: Secondary | ICD-10-CM | POA: Diagnosis not present

## 2023-09-03 DIAGNOSIS — F02B18 Dementia in other diseases classified elsewhere, moderate, with other behavioral disturbance: Secondary | ICD-10-CM | POA: Diagnosis not present

## 2023-09-03 DIAGNOSIS — G309 Alzheimer's disease, unspecified: Secondary | ICD-10-CM | POA: Diagnosis not present

## 2023-10-23 DIAGNOSIS — E538 Deficiency of other specified B group vitamins: Secondary | ICD-10-CM | POA: Diagnosis not present

## 2023-10-23 DIAGNOSIS — R413 Other amnesia: Secondary | ICD-10-CM | POA: Diagnosis not present

## 2023-10-23 DIAGNOSIS — E559 Vitamin D deficiency, unspecified: Secondary | ICD-10-CM | POA: Diagnosis not present

## 2023-10-23 DIAGNOSIS — Z79899 Other long term (current) drug therapy: Secondary | ICD-10-CM | POA: Diagnosis not present

## 2023-10-23 NOTE — Progress Notes (Signed)
 Jesus Scott is a  83 y.o. male who presents for  CHIEF COMPLAINT Chief Complaint  Patient presents with  . Follow-up  . Memory Loss  . B12 deficiency  . Calcium/vitamin D Disorders    Subjective: History of Present Illness  Pt in NAD. BP stable. Has memory loss on Namenda. Also with B12 and Vitamin D def on po supplements. Weight stable. Sleeping and eating well. Feels well. No HA's. Denies Cp or SOB. No palpitations. No change in bowels or bladder. Memory is worsening   Past Medical History:  Diagnosis Date  . Depression   . Internal hemorrhoids   . Osteoarthritis    with bilateral knee pain   Patient Active Problem List  Diagnosis  . Osteoarthritis  . Internal hemorrhoids  . Adenomatous polyp of sigmoid colon  . Vitamin D deficiency, unspecified  . Memory disturbance  . B12 deficiency    Past Surgical History:  Procedure Laterality Date  . COLONOSCOPY  03/03/2006   Hyperplastic Polyps: CBF 02/2016; Recall Ltr mailed 01/01/2016 (dw)  . BILATERAL KNEE SURGERIES    . JOINT REPLACEMENT    . TONSILLECTOMY    . VASECTOMY       Current Outpatient Medications:  .  ascorbic acid, vitamin C, (VITAMIN C) 500 MG tablet, Take 500 mg by mouth once daily., Disp: , Rfl:  .  busPIRone (BUSPAR) 10 MG tablet, TAKE 1 TABLET BY MOUTH TWICE A DAY, Disp: 180 tablet, Rfl: 2 .  cyanocobalamin  (VITAMIN B12) 1000 MCG tablet, Take 1,000 mcg by mouth once daily, Disp: , Rfl:  .  lamoTRIgine  (LAMICTAL ) 25 MG tablet, TAKE 1 TABLET (25 MG TOTAL) BY MOUTH 2 (TWO) TIMES DAILY FOR 90 DAYS, Disp: 180 tablet, Rfl: 3 .  memantine (NAMENDA) 10 MG tablet, TAKE 1 TABLET (10 MG TOTAL) BY MOUTH AT BEDTIME FOR 90 DAYS, Disp: 90 tablet, Rfl: 3 .  mirtazapine (REMERON) 15 MG tablet, TAKE 1 TABLET BY MOUTH EVERYDAY AT BEDTIME, Disp: 90 tablet, Rfl: 1 .  modafiniL (PROVIGIL) 100 MG tablet, Take 1 tablet (100 mg total) by mouth once daily, Disp: 30 tablet, Rfl: 5  Patient has no known  allergies.  Social History   Socioeconomic History  . Marital status: Married  Tobacco Use  . Smoking status: Former    Types: Cigars  . Smokeless tobacco: Never  . Tobacco comments:    occasionally  Vaping Use  . Vaping status: Never Used  Substance and Sexual Activity  . Alcohol use: Yes    Comment: ONLY DRINKS OCCASIONAL ALCOHOL  . Drug use: Not Currently  . Sexual activity: Not Currently   Social Drivers of Health   Financial Resource Strain: Low Risk  (01/21/2023)   Overall Financial Resource Strain (CARDIA)   . Difficulty of Paying Living Expenses: Not hard at all  Food Insecurity: No Food Insecurity (01/21/2023)   Hunger Vital Sign   . Worried About Programme researcher, broadcasting/film/video in the Last Year: Never true   . Ran Out of Food in the Last Year: Never true  Transportation Needs: No Transportation Needs (01/21/2023)   PRAPARE - Transportation   . Lack of Transportation (Medical): No   . Lack of Transportation (Non-Medical): No  Housing Stability: Unknown (07/22/2023)   Housing Stability Vital Sign   . Unable to Pay for Housing in the Last Year: No   . Homeless in the Last Year: No    Family History  Problem Relation Name Age of Onset  . Alzheimer's  disease Father    . Stroke Mother  33  . Alzheimer's disease Sister      A comprehensive ROS was negative except for HPI  PE: BP 118/68   Pulse 78   Ht 188 cm (6' 2)   Wt 78 kg (172 lb)   SpO2 96%   BMI 22.08 kg/m  The patient looks well today. He is in no distress. Neck is supple without adenopathy.  Carotids are 2+ bilaterally without bruits. Lungs are clear. Heart is regular rate and rhythm without murmurs, rubs, or gallops. Abdomen is soft, non tender, no masses or organomegaly. Lower extremities without obvious edema.   Office Visit on 04/24/2023  Component Date Value Ref Range Status  . WBC (White Blood Cell Count) 04/24/2023 5.1  4.1 - 10.2 10^3/uL Final  . RBC (Red Blood Cell Count) 04/24/2023 3.90 (L)  4.69 - 6.13  10^6/uL Final  . Hemoglobin 04/24/2023 12.6 (L)  14.1 - 18.1 gm/dL Final  . Hematocrit 87/94/7975 36.9 (L)  40.0 - 52.0 % Final  . MCV (Mean Corpuscular Volume) 04/24/2023 94.6  80.0 - 100.0 fl Final  . MCH (Mean Corpuscular Hemoglobin) 04/24/2023 32.3 (H)  27.0 - 31.2 pg Final  . MCHC (Mean Corpuscular Hemoglobin * 04/24/2023 34.1  32.0 - 36.0 gm/dL Final  . Platelet Count 04/24/2023 160  150 - 450 10^3/uL Final  . RDW-CV (Red Cell Distribution Widt* 04/24/2023 12.2  11.6 - 14.8 % Final  . MPV (Mean Platelet Volume) 04/24/2023 8.6 (L)  9.4 - 12.4 fl Final  . Neutrophils 04/24/2023 3.16  1.50 - 7.80 10^3/uL Final  . Lymphocytes 04/24/2023 1.27  1.00 - 3.60 10^3/uL Final  . Monocytes 04/24/2023 0.43  0.00 - 1.50 10^3/uL Final  . Eosinophils 04/24/2023 0.14  0.00 - 0.55 10^3/uL Final  . Basophils 04/24/2023 0.05  0.00 - 0.09 10^3/uL Final  . Neutrophil % 04/24/2023 62.4  32.0 - 70.0 % Final  . Lymphocyte % 04/24/2023 25.1  10.0 - 50.0 % Final  . Monocyte % 04/24/2023 8.5  4.0 - 13.0 % Final  . Eosinophil % 04/24/2023 2.8  1.0 - 5.0 % Final  . Basophil% 04/24/2023 1.0  0.0 - 2.0 % Final  . Immature Granulocyte % 04/24/2023 0.2  <=0.7 % Final  . Immature Granulocyte Count 04/24/2023 0.01  <=0.06 10^3/L Final  . Glucose 04/24/2023 77  70 - 110 mg/dL Final  . Sodium 87/94/7975 139  136 - 145 mmol/L Final  . Potassium 04/24/2023 4.4  3.6 - 5.1 mmol/L Final  . Chloride 04/24/2023 104  97 - 109 mmol/L Final  . Carbon Dioxide (CO2) 04/24/2023 28.7  22.0 - 32.0 mmol/L Final  . Urea Nitrogen (BUN) 04/24/2023 16  7 - 25 mg/dL Final  . Creatinine 87/94/7975 1.1  0.7 - 1.3 mg/dL Final  . Glomerular Filtration Rate (eGFR) 04/24/2023 67  >60 mL/min/1.73sq m Final  . Calcium 04/24/2023 9.0  8.7 - 10.3 mg/dL Final  . AST  87/94/7975 20  8 - 39 U/L Final  . ALT  04/24/2023 16  6 - 57 U/L Final  . Alk Phos (alkaline Phosphatase) 04/24/2023 74  34 - 104 U/L Final  . Albumin 04/24/2023 3.9  3.5 - 4.8 g/dL  Final  . Bilirubin, Total 04/24/2023 0.5  0.3 - 1.2 mg/dL Final  . Protein, Total 04/24/2023 7.0  6.1 - 7.9 g/dL Final  . A/G Ratio 87/94/7975 1.3  1.0 - 5.0 gm/dL Final  . Vitamin B12 87/94/7975 663  >300  pg/mL Final  . Color 04/24/2023 Light Yellow  Colorless, Straw, Light Yellow, Yellow, Dark Yellow Final  . Clarity 04/24/2023 Clear  Clear Final  . Specific Gravity 04/24/2023 1.018  1.005 - 1.030 Final  . pH, Urine 04/24/2023 6.0  5.0 - 8.0 Final  . Protein, Urinalysis 04/24/2023 Negative  Negative mg/dL Final  . Glucose, Urinalysis 04/24/2023 Negative  Negative mg/dL Final  . Ketones, Urinalysis 04/24/2023 Negative  Negative mg/dL Final  . Blood, Urinalysis 04/24/2023 Trace (!)  Negative Final  . Nitrite, Urinalysis 04/24/2023 Negative  Negative Final  . Leukocyte Esterase, Urinalysis 04/24/2023 Negative  Negative Final  . Bilirubin, Urinalysis 04/24/2023 Negative  Negative Final  . Urobilinogen, Urinalysis 04/24/2023 0.2  0.2 - 1.0 mg/dL Final  . Mucous, Urine 04/24/2023 PRESENT (!)  None Seen Final  . WBC, UA 04/24/2023 1  <=5 /hpf Final  . Red Blood Cells, Urinalysis 04/24/2023 3  <=3 /hpf Final  . Bacteria, Urinalysis 04/24/2023 0-5  0 - 5 /hpf Final  . Squamous Epithelial Cells, Urinaly* 04/24/2023 0  /hpf Final  . PSA (Prostate Specific Antigen), T* 04/24/2023 1.89  0.10 - 4.00 ng/mL Final  Office Visit on 01/21/2023  Component Date Value Ref Range Status  . Iron 01/21/2023 88  45 - 182 ug/dL Final  . Total Iron Binding Capacity (TIBC) 01/21/2023 221.2 (L)  261.0 - 478.0 ug/dL Final  . Transferrin 90/96/7975 158.0 (L)  203.0 - 362.0 mg/dL Final  . % Saturation 90/96/7975 40  % Final  . WBC (White Blood Cell Count) 01/21/2023 5.1  4.1 - 10.2 10^3/uL Final  . RBC (Red Blood Cell Count) 01/21/2023 3.71 (L)  4.69 - 6.13 10^6/uL Final  . Hemoglobin 01/21/2023 11.9 (L)  14.1 - 18.1 gm/dL Final  . Hematocrit 90/96/7975 35.2 (L)  40.0 - 52.0 % Final  . MCV (Mean Corpuscular Volume)  01/21/2023 94.9  80.0 - 100.0 fl Final  . MCH (Mean Corpuscular Hemoglobin) 01/21/2023 32.1 (H)  27.0 - 31.2 pg Final  . MCHC (Mean Corpuscular Hemoglobin * 01/21/2023 33.8  32.0 - 36.0 gm/dL Final  . Platelet Count 01/21/2023 162  150 - 450 10^3/uL Final  . RDW-CV (Red Cell Distribution Widt* 01/21/2023 12.5  11.6 - 14.8 % Final  . MPV (Mean Platelet Volume) 01/21/2023 8.9 (L)  9.4 - 12.4 fl Final  . Neutrophils 01/21/2023 3.19  1.50 - 7.80 10^3/uL Final  . Lymphocytes 01/21/2023 1.28  1.00 - 3.60 10^3/uL Final  . Monocytes 01/21/2023 0.46  0.00 - 1.50 10^3/uL Final  . Eosinophils 01/21/2023 0.13  0.00 - 0.55 10^3/uL Final  . Basophils 01/21/2023 0.04  0.00 - 0.09 10^3/uL Final  . Neutrophil % 01/21/2023 62.5  32.0 - 70.0 % Final  . Lymphocyte % 01/21/2023 25.0  10.0 - 50.0 % Final  . Monocyte % 01/21/2023 9.0  4.0 - 13.0 % Final  . Eosinophil % 01/21/2023 2.5  1.0 - 5.0 % Final  . Basophil% 01/21/2023 0.8  0.0 - 2.0 % Final  . Immature Granulocyte % 01/21/2023 0.2  <=0.7 % Final  . Immature Granulocyte Count 01/21/2023 0.01  <=0.06 10^3/L Final  . Glucose 01/21/2023 88  70 - 110 mg/dL Final  . Sodium 90/96/7975 143  136 - 145 mmol/L Final  . Potassium 01/21/2023 4.4  3.6 - 5.1 mmol/L Final  . Chloride 01/21/2023 107  97 - 109 mmol/L Final  . Carbon Dioxide (CO2) 01/21/2023 32.1 (H)  22.0 - 32.0 mmol/L Final  . Urea Nitrogen (BUN) 01/21/2023  18  7 - 25 mg/dL Final  . Creatinine 90/96/7975 1.1  0.7 - 1.3 mg/dL Final  . Glomerular Filtration Rate (eGFR) 01/21/2023 67  >60 mL/min/1.73sq m Final  . Calcium 01/21/2023 9.0  8.7 - 10.3 mg/dL Final  . AST  90/96/7975 17  8 - 39 U/L Final  . ALT  01/21/2023 12  6 - 57 U/L Final  . Alk Phos (alkaline Phosphatase) 01/21/2023 63  34 - 104 U/L Final  . Albumin 01/21/2023 3.8  3.5 - 4.8 g/dL Final  . Bilirubin, Total 01/21/2023 0.5  0.3 - 1.2 mg/dL Final  . Protein, Total 01/21/2023 6.6  6.1 - 7.9 g/dL Final  . A/G Ratio 90/96/7975 1.4  1.0 -  5.0 gm/dL Final  . Thyroid  Stimulating Hormone (TSH) 01/21/2023 1.854  0.450-5.330 uIU/ml uIU/mL Final  . Color 01/21/2023 Yellow  Colorless, Straw, Light Yellow, Yellow, Dark Yellow Final  . Clarity 01/21/2023 Clear  Clear Final  . Specific Gravity 01/21/2023 1.026  1.005 - 1.030 Final  . pH, Urine 01/21/2023 6.5  5.0 - 8.0 Final  . Protein, Urinalysis 01/21/2023 Trace (!)  Negative mg/dL Final  . Glucose, Urinalysis 01/21/2023 Negative  Negative mg/dL Final  . Ketones, Urinalysis 01/21/2023 Negative  Negative mg/dL Final  . Blood, Urinalysis 01/21/2023 Negative  Negative Final  . Nitrite, Urinalysis 01/21/2023 Negative  Negative Final  . Leukocyte Esterase, Urinalysis 01/21/2023 Negative  Negative Final  . Bilirubin, Urinalysis 01/21/2023 Negative  Negative Final  . Urobilinogen, Urinalysis 01/21/2023 0.2  0.2 - 1.0 mg/dL Final  . Mucous, Urine 01/21/2023 PRESENT (!)  None Seen Final  . WBC, UA 01/21/2023 2  <=5 /hpf Final  . Red Blood Cells, Urinalysis 01/21/2023 4 (H)  <=3 /hpf Final  . Bacteria, Urinalysis 01/21/2023 0-5  0 - 5 /hpf Final  . Squamous Epithelial Cells, Urinaly* 01/21/2023 1  /hpf Final  Office Visit on 10/30/2022  Component Date Value Ref Range Status  . WBC (White Blood Cell Count) 10/30/2022 4.5  4.1 - 10.2 10^3/uL Final  . RBC (Red Blood Cell Count) 10/30/2022 3.64 (L)  4.69 - 6.13 10^6/uL Final  . Hemoglobin 10/30/2022 11.6 (L)  14.1 - 18.1 gm/dL Final  . Hematocrit 93/87/7975 34.5 (L)  40.0 - 52.0 % Final  . MCV (Mean Corpuscular Volume) 10/30/2022 94.8  80.0 - 100.0 fl Final  . MCH (Mean Corpuscular Hemoglobin) 10/30/2022 31.9 (H)  27.0 - 31.2 pg Final  . MCHC (Mean Corpuscular Hemoglobin * 10/30/2022 33.6  32.0 - 36.0 gm/dL Final  . Platelet Count 10/30/2022 161  150 - 450 10^3/uL Final  . RDW-CV (Red Cell Distribution Widt* 10/30/2022 12.3  11.6 - 14.8 % Final  . MPV (Mean Platelet Volume) 10/30/2022 9.2 (L)  9.4 - 12.4 fl Final  . Neutrophils 10/30/2022  2.75  1.50 - 7.80 10^3/uL Final  . Lymphocytes 10/30/2022 1.19  1.00 - 3.60 10^3/uL Final  . Monocytes 10/30/2022 0.38  0.00 - 1.50 10^3/uL Final  . Eosinophils 10/30/2022 0.12  0.00 - 0.55 10^3/uL Final  . Basophils 10/30/2022 0.03  0.00 - 0.09 10^3/uL Final  . Neutrophil % 10/30/2022 61.3  32.0 - 70.0 % Final  . Lymphocyte % 10/30/2022 26.6  10.0 - 50.0 % Final  . Monocyte % 10/30/2022 8.5  4.0 - 13.0 % Final  . Eosinophil % 10/30/2022 2.7  1.0 - 5.0 % Final  . Basophil% 10/30/2022 0.7  0.0 - 2.0 % Final  . Immature Granulocyte % 10/30/2022 0.2  <=0.7 %  Final  . Immature Granulocyte Count 10/30/2022 0.01  <=0.06 10^3/L Final  . Glucose 10/30/2022 89  70 - 110 mg/dL Final  . Sodium 93/87/7975 140  136 - 145 mmol/L Final  . Potassium 10/30/2022 4.3  3.6 - 5.1 mmol/L Final  . Chloride 10/30/2022 107  97 - 109 mmol/L Final  . Carbon Dioxide (CO2) 10/30/2022 28.6  22.0 - 32.0 mmol/L Final  . Urea Nitrogen (BUN) 10/30/2022 17  7 - 25 mg/dL Final  . Creatinine 93/87/7975 1.0  0.7 - 1.3 mg/dL Final  . Glomerular Filtration Rate (eGFR) 10/30/2022 75  >60 mL/min/1.73sq m Final  . Calcium 10/30/2022 8.4 (L)  8.7 - 10.3 mg/dL Final  . AST  93/87/7975 19  8 - 39 U/L Final  . ALT  10/30/2022 14  6 - 57 U/L Final  . Alk Phos (alkaline Phosphatase) 10/30/2022 66  34 - 104 U/L Final  . Albumin 10/30/2022 3.7  3.5 - 4.8 g/dL Final  . Bilirubin, Total 10/30/2022 0.5  0.3 - 1.2 mg/dL Final  . Protein, Total 10/30/2022 6.6  6.1 - 7.9 g/dL Final  . A/G Ratio 93/87/7975 1.3  1.0 - 5.0 gm/dL Final  . Color 93/87/7975 Yellow  Colorless, Straw, Light Yellow, Yellow, Dark Yellow Final  . Clarity 10/30/2022 Clear  Clear Final  . Specific Gravity 10/30/2022 1.025  1.005 - 1.030 Final  . pH, Urine 10/30/2022 6.0  5.0 - 8.0 Final  . Protein, Urinalysis 10/30/2022 Trace (!)  Negative mg/dL Final  . Glucose, Urinalysis 10/30/2022 Negative  Negative mg/dL Final  . Ketones, Urinalysis 10/30/2022 Negative  Negative  mg/dL Final  . Blood, Urinalysis 10/30/2022 Negative  Negative Final  . Nitrite, Urinalysis 10/30/2022 Negative  Negative Final  . Leukocyte Esterase, Urinalysis 10/30/2022 Negative  Negative Final  . Bilirubin, Urinalysis 10/30/2022 Negative  Negative Final  . Urobilinogen, Urinalysis 10/30/2022 0.2  0.2 - 1.0 mg/dL Final  . Mucous, Urine 10/30/2022 PRESENT (!)  None Seen Final  . WBC, UA 10/30/2022 1  <=5 /hpf Final  . Red Blood Cells, Urinalysis 10/30/2022 2  <=3 /hpf Final  . Bacteria, Urinalysis 10/30/2022 0-5  0 - 5 /hpf Final  . Squamous Epithelial Cells, Urinaly* 10/30/2022 0  /hpf Final  . Vitamin D, 25-Hydroxy - [facility] 10/30/2022 34.6  30.0 - 100.0 ng/mL Final   DIAGNOSIS: Vitamin D deficiency, unspecified  (primary encounter diagnosis)  Memory disturbance  B12 deficiency   PLAN: No change in care. Labs today. RTC 3 mo, sooner if needed     Attestation Statement:   I personally performed the service. (TP)  Reyes JONETTA Costa, MD, MD

## 2023-12-03 IMAGING — US US EXTREM UP *R* LTD
1 series · 15 of 16 positions shown · non-contrast
Comparison: None.

CLINICAL DATA: Lesion on the right upper arm over the deltoid.

EXAM:
ULTRASOUND RIGHT UPPER EXTREMITY LIMITED
TECHNIQUE: Ultrasound examination of the upper extremity soft tissues was
performed in the area of clinical concern.

[Series 1: us extrem low bilat ltd · 16 acquisitions, 15 frames shown]
[im 1/16]
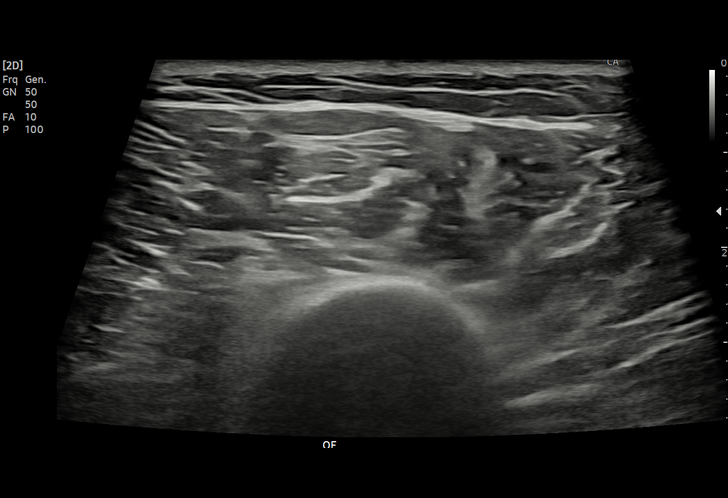
[im 2/16]
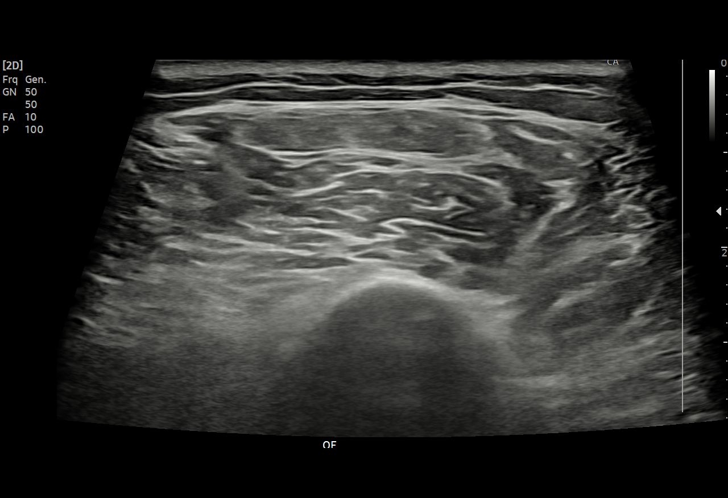
[im 3/16]
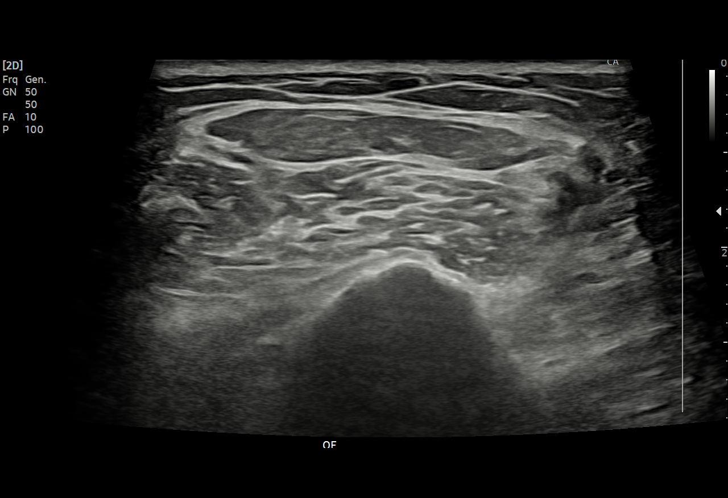
[im 4/16]
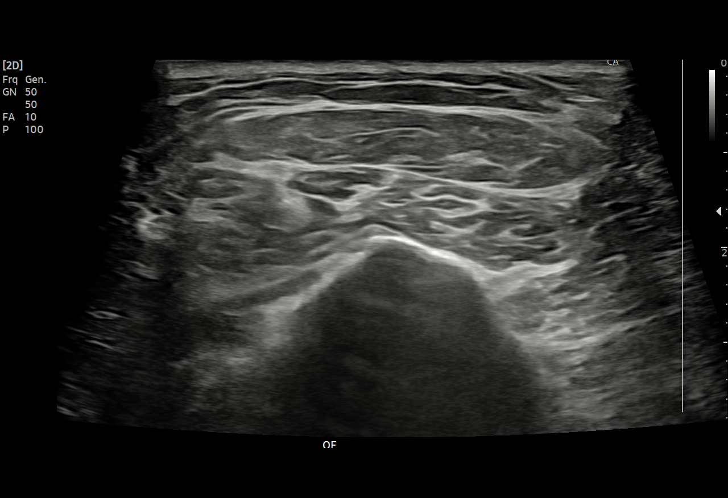
[im 5/16]
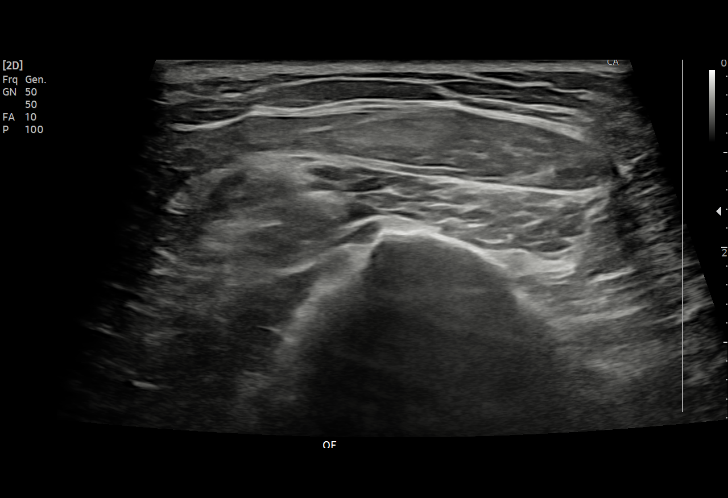
[im 6/16]
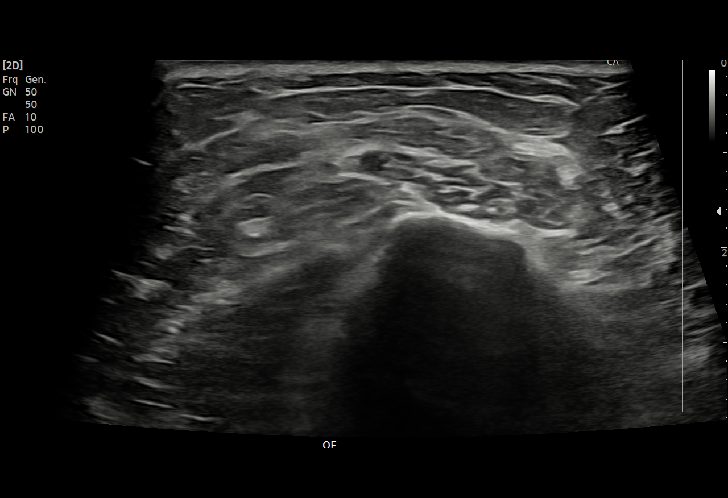
[im 7/16]
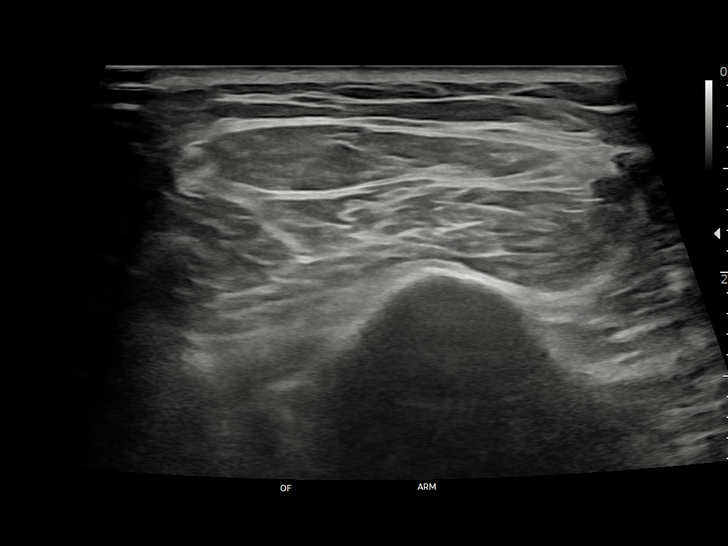
[im 9/16]
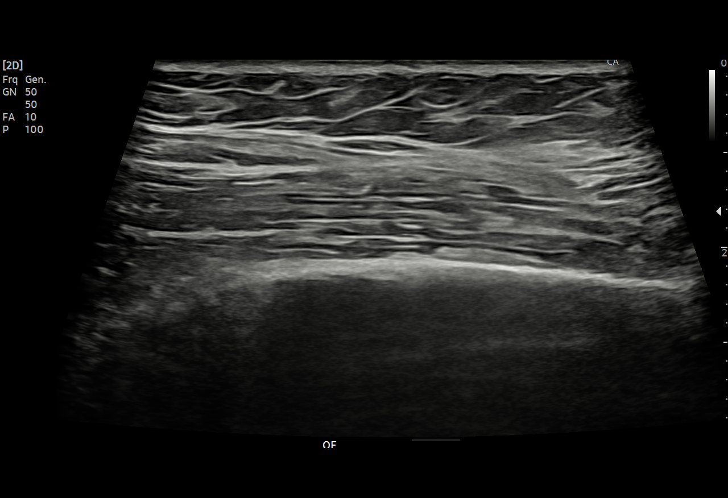
[im 10/16]
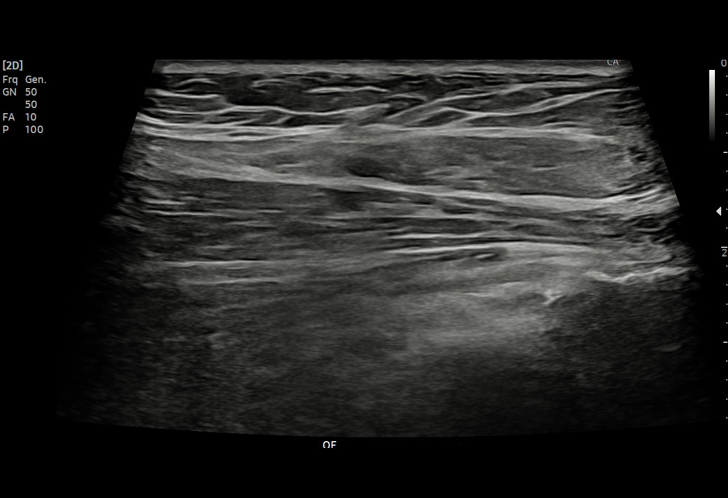
[im 11/16]
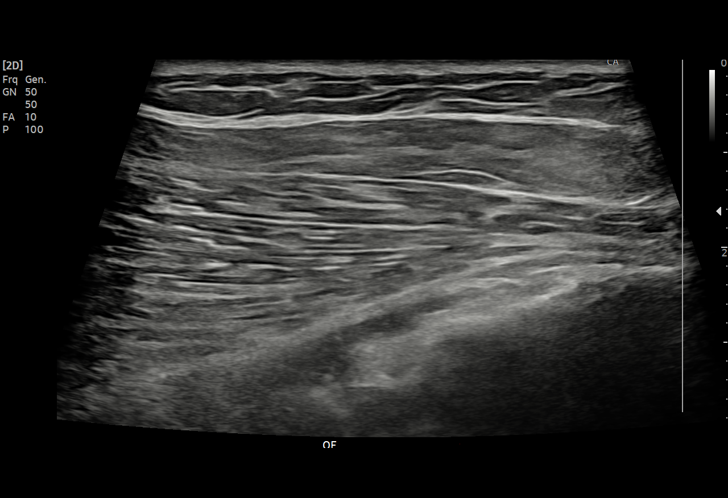
[im 12/16]
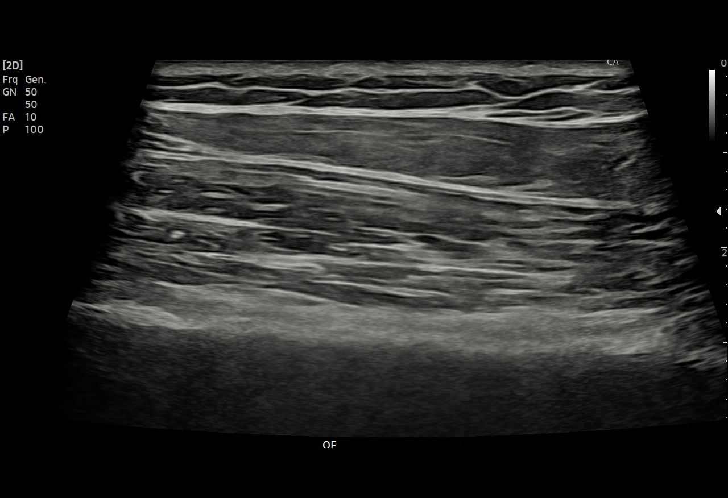
[im 13/16]
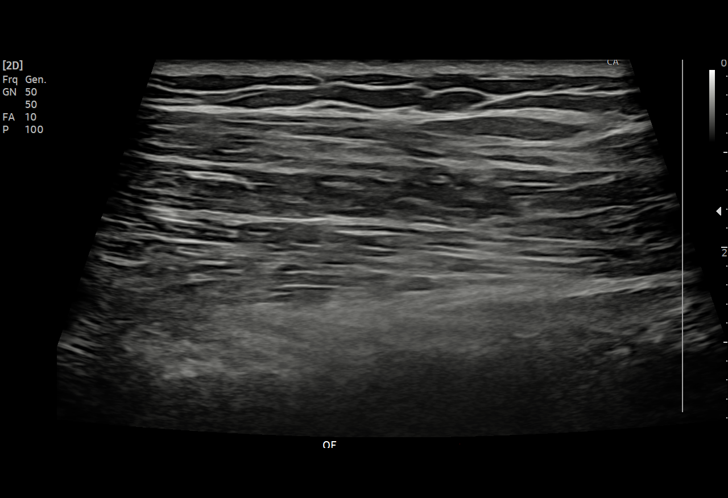
[im 14/16]
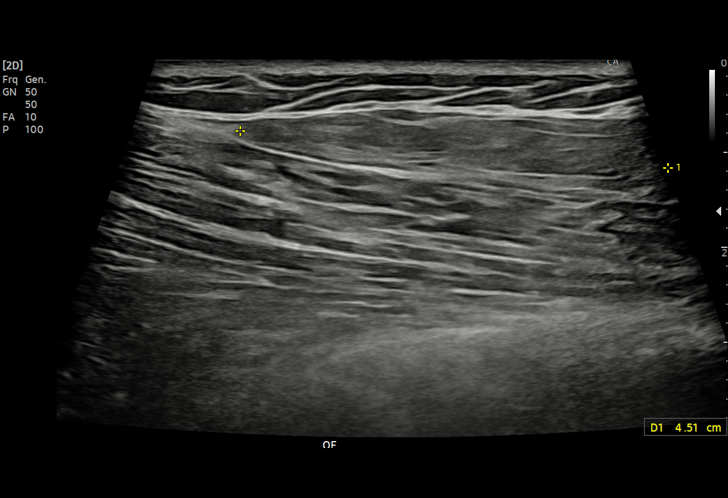
[im 15/16]
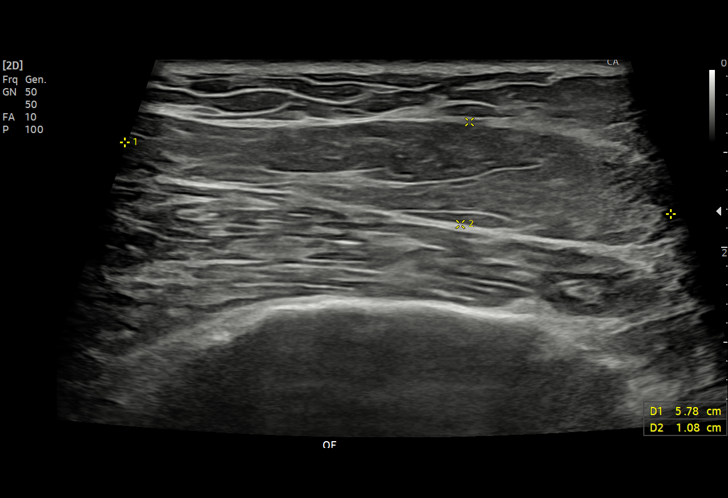
[im 16/16]
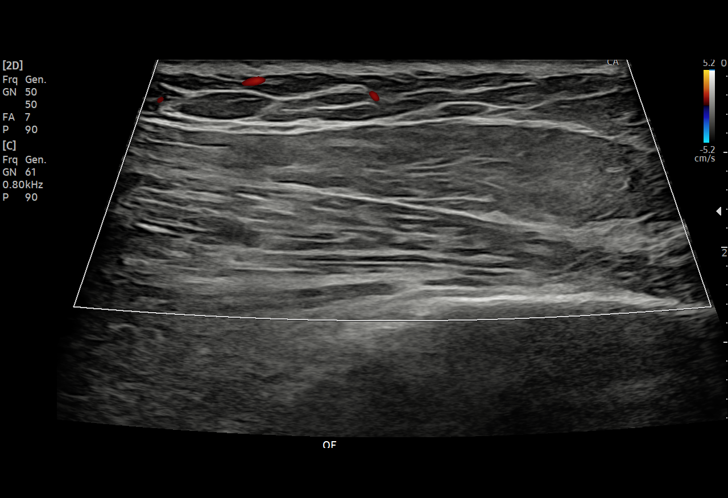

[15 of 16 positions shown; findings below may reference images not displayed]

FINDINGS: Scanning was directed toward the region of concern. An area
measuring 10.3 x 1.1 x 4.0 cm is identified. This area is isoechoic
to surrounding fat lobules and has a few thin septations within it.
No fluid collection or other abnormality is identified.
IMPRESSION: Findings most consistent with a lipoma in the region of concern.

## 2023-12-12 ENCOUNTER — Ambulatory Visit: Admission: EM | Admit: 2023-12-12 | Discharge: 2023-12-12 | Disposition: A | Discharge status: Home / Self Care

## 2023-12-12 DIAGNOSIS — L578 Other skin changes due to chronic exposure to nonionizing radiation: Secondary | ICD-10-CM | POA: Diagnosis not present

## 2023-12-12 MED ORDER — PIMECROLIMUS 1 % EX CREA: TOPICAL_CREAM | Freq: Two times a day (BID) | CUTANEOUS | 0 refills | Status: AC

## 2023-12-12 NOTE — ED Triage Notes (Signed)
 Patient to Urgent Care with complaints of rash present to bilateral arms/ legs/ face/ chest.   Symptoms x4 days. Denies any food/ product usage.   Using cortisone cream.

## 2023-12-12 NOTE — ED Provider Notes (Signed)
 Jesus Scott    CSN: [id number] Arrival date & time: 12/12/23  1403      History   Chief Complaint Chief Complaint  Patient presents with   Rash    HPI Jesus Scott is a 83 y.o. male.   Discussed the use of AI scribe software for clinical note transcription with the patient, who gave verbal consent to proceed.   History provided by patient and wife   The patient presents with a skin rash on both forearms and hands that has been present for more than a week. The rash is described as rough and uncomfortable and affects the insides of both arms, with some involvement on the outside of the arms as well. The patient's fingers are also affected. The rash is not painful or itchy, but he finds it annoying. It does not drain or cause any burning sensation. He reports that the rash just keeps coming without periods of improvement or resolution. This is the first time he has experienced this type of skin condition. The rash appears to be worsening after using over-the-counter cortisone cream. He denies any recent illnesses, including cough, cold, or congestion. There have been no changes in soaps, lotions, detergents, medications, or diet. He has not engaged in any new outdoor activities recently, although he used to walk outside frequently. No other associated symptoms or systemic involvement are reported.  The following portions of the patient's history were reviewed and updated as appropriate: allergies, current medications, past family history, past medical history, past social history, past surgical history, and problem list.    Past Medical History:  Diagnosis Date   Arthritis    hands   Memory loss    mild    There are no active problems to display for this patient.   Past Surgical History:  Procedure Laterality Date   CATARACT EXTRACTION W/PHACO Right 06/09/2019   Procedure: CATARACT EXTRACTION PHACO AND INTRAOCULAR LENS PLACEMENT (IOC) RIGHT 5.90  00:55.7   10.7%;  Surgeon: Mittie Gaskin, MD;  Location: [facility];  Service: Ophthalmology;  Laterality: Right;   COLONOSCOPY     INGUINAL HERNIA REPAIR Left 06/17/2018   Procedure: HERNIA REPAIR INGUINAL ADULT;  Surgeon: Rodolph Romano, MD;  Location: [facility];  Service: General;  Laterality: Left;   JOINT REPLACEMENT Bilateral 2010, 2012   knee   TONSILLECTOMY         Home Medications    Prior to Admission medications   Medication Sig Start Date End Date Taking? Authorizing Provider  busPIRone (BUSPAR) 10 MG tablet Take 10 mg by mouth 2 (two) times daily. 07/18/23  Yes [provider]  pimecrolimus (ELIDEL) 1 % cream Apply topically 2 (two) times daily. 12/12/23  Yes Annelie Boak, FNP  amitriptyline (ELAVIL) 25 MG tablet Take 25 mg by mouth at bedtime.    [provider]  Apoaequorin 10 MG CAPS Take 10 mg by mouth daily.     [provider]  Ascorbic Acid (VITAMIN C PO) Take by mouth daily.    [provider]  donepezil (ARICEPT) 5 MG tablet Take 5 mg by mouth daily.    [provider]  HYDROcodone -acetaminophen  (NORCO/VICODIN) 5-325 MG tablet Take 1 tablet by mouth every 8 (eight) hours as needed for severe pain. Patient not taking: Reported on 12/12/2023 06/13/22   Hilma Hastings, PA-C  lamoTRIgine  (LAMICTAL ) 25 MG tablet Take 25 mg by mouth 2 (two) times daily.    [provider]  memantine (NAMENDA) 10 MG  tablet Take 10 mg by mouth at bedtime.    [provider]  mirtazapine (REMERON) 15 MG tablet Take 15 mg by mouth at bedtime.    [provider]  Multiple Vitamins-Minerals (AIRBORNE PO) Take by mouth daily.    [provider]    Family History History reviewed. No pertinent family history.  Social History Social History   Tobacco Use   Smoking status: Former    Current packs/day: 0.00    Average packs/day: 1 pack/day for 20.0 years (20.0 ttl pk-yrs)    Types: Cigarettes     Start date: 06/16/1978    Quit date: 06/16/1998    Years since quitting: 25.5   Smokeless tobacco: Never   Tobacco comments:    may have occasional cig, none for several months  Vaping Use   Vaping status: Never Used  Substance Use Topics   Alcohol use: Yes    Alcohol/week: 1.0 standard drink of alcohol    Types: 1 Standard drinks or equivalent per week    Comment: occ    Drug use: Never     Allergies   Patient has no known allergies.   Review of Systems Review of Systems  Constitutional:  Negative for fever.  Skin:  Positive for rash.  All other systems reviewed and are negative.    Physical Exam Triage Vital Signs ED Triage Vitals [12/12/23 1611]  Encounter Vitals Group     BP      Girls Systolic BP Percentile      Girls Diastolic BP Percentile      Boys Systolic BP Percentile      Boys Diastolic BP Percentile      Pulse      Resp      Temp      Temp src      SpO2      Weight      Height      Head Circumference      Peak Flow      Pain Score 0     Pain Loc      Pain Education      Exclude from Growth Chart    No data found.  Updated Vital Signs BP 119/66   Pulse 71   Temp 98 F (36.7 C)   Resp 18   SpO2 99%   Visual Acuity Right Eye Distance:   Left Eye Distance:   Bilateral Distance:    Right Eye Near:   Left Eye Near:    Bilateral Near:     Physical Exam Vitals reviewed.  Constitutional:      General: He is awake. He is not in acute distress.    Appearance: Normal appearance. He is well-developed. He is not ill-appearing, toxic-appearing or diaphoretic.  HENT:     Head: Normocephalic.     Right Ear: Hearing normal.     Left Ear: Hearing normal.     Nose: Nose normal.     Mouth/Throat:     Mouth: Mucous membranes are moist.  Eyes:     General: Vision grossly intact.     Conjunctiva/sclera: Conjunctivae normal.  Cardiovascular:     Rate and Rhythm: Normal rate and regular rhythm.     Heart sounds: Normal heart sounds.   Pulmonary:     Effort: Pulmonary effort is normal.     Breath sounds: Normal breath sounds and air entry.  Musculoskeletal:        General: Normal range of motion.  Cervical back: Full passive range of motion without pain, normal range of motion and neck supple.  Skin:    General: Skin is warm and dry.     Findings: Rash present.     Comments: Erythematous rash involving the bilateral hands, including the fingers, dorsal, and palmar surfaces, as well as the forearms. The rash is diffuse and non-blanching with no associated swelling, increased warmth, or drainage. Skin appears rough in texture with areas of mild scaling. No open lesions present (see images below)   Neurological:     General: No focal deficit present.     Mental Status: He is alert and oriented to person, place, and time.  Psychiatric:        Speech: Speech normal.        Behavior: Behavior is cooperative.     Right hand   Left thumb (dorsal aspect)   Right hand   Left thumb (palmar aspect)   Left forearm   UC Treatments / Results  Labs (all labs ordered are listed, but only abnormal results are displayed) Labs Reviewed - No data to display  EKG   Radiology No results found.  Procedures Procedures (including critical care time)  Medications Ordered in UC Medications - No data to display  Initial Impression / Assessment and Plan / UC Course  I have reviewed the triage vital signs and the nursing notes.  Pertinent labs & imaging results that were available during my care of the patient were reviewed by me and considered in my medical decision making (see chart for details).     Patient presents with a persistent, non-itchy, non-painful rash affecting both arms, hands, and fingers for over a week. The rash is described as rough and uncomfortable, with lesions of varying sizes, primarily affecting unexposed areas such as the dorsal hands and forearms. No history of recent illness, new products, or  significant environmental exposures. Symptoms have worsened with use of over-the-counter cortisone, suggesting a non-responsive dermatitis, possibly actinic dermatitis. Cortisone and other steroid creams were discontinued. Pimecrolimus topical cream was prescribed for use twice daily, along with recommendation to apply a thick moisturizing cream such as CeraVe, Eucerin, or Aquaphor multiple times daily. Improvement is expected within 2-4 weeks. Patient was educated on the condition and advised to follow up with dermatology if symptoms worsen or do not improve. ED evaluation is not indicated unless signs of infection such as redness, warmth, drainage, or fever develop.  Today's evaluation has revealed no signs of a dangerous process. Discussed diagnosis with patient and/or guardian. Patient and/or guardian aware of their diagnosis, possible red flag symptoms to watch out for and need for close follow up. Patient and/or guardian understands verbal and written discharge instructions. Patient and/or guardian comfortable with plan and disposition.  Patient and/or guardian has a clear mental status at this time, good insight into illness (after discussion and teaching) and has clear judgment to make decisions regarding their care  Documentation was completed with the aid of voice recognition software. Transcription may contain typographical errors.  Final Clinical Impressions(s) / UC Diagnoses   Final diagnoses:  Actinic dermatitis     Discharge Instructions      You have been diagnosed with a form of dermatitis, possibly actinic dermatitis, which is causing a rough, uncomfortable rash on your arms, hands, and fingers. This rash is not infectious and does not appear to be caused by an allergic reaction. Steroid creams like cortisone may be worsening the condition and should be discontinued.  Apply the prescribed cream to the affected areas twice daily as directed. In addition, use a thick moisturizing  cream such as CeraVe, Eucerin, or Aquaphor several times a day to keep the skin hydrated and reduce irritation. Avoid scratching or picking at the rash, and protect your skin from excessive sun exposure.  Improvement is expected within 2 to 4 weeks. If the rash worsens, spreads, becomes painful, or develops signs of infection such as redness, warmth, drainage, or fever, contact your primary care provider. Follow up with dermatology if there is no improvement or if symptoms become more severe. Seek emergency care if you develop difficulty breathing, swelling of the face or throat, or other signs of a severe allergic reaction.      ED Prescriptions     Medication Sig Dispense Auth. Provider   pimecrolimus (ELIDEL) 1 % cream Apply topically 2 (two) times daily. 30 g Iola Lukes, FNP      PDMP not reviewed this encounter.   Iola Lukes, [place] 12/12/23 [id number]

## 2023-12-12 NOTE — Discharge Instructions (Addendum)
 You have been diagnosed with a form of dermatitis, possibly actinic dermatitis, which is causing a rough, uncomfortable rash on your arms, hands, and fingers. This rash is not infectious and does not appear to be caused by an allergic reaction. Steroid creams like cortisone may be worsening the condition and should be discontinued.  Apply the prescribed cream to the affected areas twice daily as directed. In addition, use a thick moisturizing cream such as CeraVe, Eucerin, or Aquaphor several times a day to keep the skin hydrated and reduce irritation. Avoid scratching or picking at the rash, and protect your skin from excessive sun exposure.  Improvement is expected within 2 to 4 weeks. If the rash worsens, spreads, becomes painful, or develops signs of infection such as redness, warmth, drainage, or fever, contact your primary care provider. Follow up with dermatology if there is no improvement or if symptoms become more severe. Seek emergency care if you develop difficulty breathing, swelling of the face or throat, or other signs of a severe allergic reaction.

## 2024-01-29 DIAGNOSIS — F0280 Dementia in other diseases classified elsewhere without behavioral disturbance: Secondary | ICD-10-CM | POA: Diagnosis not present

## 2024-01-29 DIAGNOSIS — F0150 Vascular dementia without behavioral disturbance: Secondary | ICD-10-CM | POA: Diagnosis not present

## 2024-01-29 DIAGNOSIS — G4700 Insomnia, unspecified: Secondary | ICD-10-CM | POA: Diagnosis not present

## 2024-01-29 DIAGNOSIS — G309 Alzheimer's disease, unspecified: Secondary | ICD-10-CM | POA: Diagnosis not present

## 2024-02-12 DIAGNOSIS — R413 Other amnesia: Secondary | ICD-10-CM | POA: Diagnosis not present

## 2024-02-12 DIAGNOSIS — Z79899 Other long term (current) drug therapy: Secondary | ICD-10-CM | POA: Diagnosis not present

## 2024-03-02 DIAGNOSIS — Z79899 Other long term (current) drug therapy: Secondary | ICD-10-CM | POA: Diagnosis not present

## 2024-03-02 DIAGNOSIS — E538 Deficiency of other specified B group vitamins: Secondary | ICD-10-CM | POA: Diagnosis not present

## 2024-03-02 DIAGNOSIS — Z125 Encounter for screening for malignant neoplasm of prostate: Secondary | ICD-10-CM | POA: Diagnosis not present

## 2024-03-02 DIAGNOSIS — F0150 Vascular dementia without behavioral disturbance: Secondary | ICD-10-CM | POA: Diagnosis not present

## 2024-03-02 DIAGNOSIS — G309 Alzheimer's disease, unspecified: Secondary | ICD-10-CM | POA: Diagnosis not present

## 2024-03-02 DIAGNOSIS — F0280 Dementia in other diseases classified elsewhere without behavioral disturbance: Secondary | ICD-10-CM | POA: Diagnosis not present

## 2024-03-02 DIAGNOSIS — Z1331 Encounter for screening for depression: Secondary | ICD-10-CM | POA: Diagnosis not present
